# Patient Record
Sex: Male | Born: 1960 | Race: White | Hispanic: No | Marital: Married | Smoking: Never smoker
Health system: Southern US, Community
[De-identification: ages and names within clinical notes are randomized; demographics above are authoritative.]

## PROBLEM LIST (undated history)

## (undated) DIAGNOSIS — C4490 Unspecified malignant neoplasm of skin, unspecified: Secondary | ICD-10-CM

## (undated) DIAGNOSIS — F419 Anxiety disorder, unspecified: Secondary | ICD-10-CM

## (undated) DIAGNOSIS — F320 Major depressive disorder, single episode, mild: Secondary | ICD-10-CM

## (undated) DIAGNOSIS — F32A Depression, unspecified: Secondary | ICD-10-CM

## (undated) HISTORY — DX: Anxiety disorder, unspecified: F41.9

## (undated) HISTORY — DX: Major depressive disorder, single episode, mild: F32.0

## (undated) HISTORY — DX: Depression, unspecified: F32.A

## (undated) HISTORY — PX: CYST REMOVAL HAND: SHX6279

## (undated) HISTORY — DX: Unspecified malignant neoplasm of skin, unspecified: C44.90

## (undated) HISTORY — PX: SKIN BIOPSY: SHX1

## (undated) HISTORY — PX: SKIN CANCER EXCISION: SHX779

---

## 2009-06-16 ENCOUNTER — Ambulatory Visit: Payer: Self-pay | Admitting: Family Medicine

## 2009-06-16 LAB — CONVERTED CEMR LAB
Bilirubin Urine: NEGATIVE
Blood in Urine, dipstick: NEGATIVE
Glucose, Urine, Semiquant: NEGATIVE
Ketones, urine, test strip: NEGATIVE
Nitrite: NEGATIVE
Protein, U semiquant: NEGATIVE
Specific Gravity, Urine: 1.025
Urobilinogen, UA: 0.2
WBC Urine, dipstick: NEGATIVE
pH: 6

## 2009-06-20 LAB — CONVERTED CEMR LAB
ALT: 23 units/L (ref 0–53)
Alkaline Phosphatase: 53 units/L (ref 39–117)
Basophils Relative: 0.6 % (ref 0.0–3.0)
Bilirubin, Direct: 0.1 mg/dL (ref 0.0–0.3)
Calcium: 9.3 mg/dL (ref 8.4–10.5)
Creatinine, Ser: 1.1 mg/dL (ref 0.4–1.5)
Eosinophils Relative: 1.1 % (ref 0.0–5.0)
GFR calc non Af Amer: 76.34 mL/min (ref >60)
HDL: 65 mg/dL (ref >39.00)
LDL Cholesterol: 45 mg/dL (ref 0–99)
Lymphocytes Relative: 30.9 % (ref 12.0–46.0)
Neutrophils Relative %: 60.3 % (ref 43.0–77.0)
Platelets: 244 10*3/uL (ref 150.0–400.0)
RBC: 4.58 M/uL (ref 4.22–5.81)
Total Bilirubin: 1.6 mg/dL — ABNORMAL HIGH (ref 0.3–1.2)
Total CHOL/HDL Ratio: 2
Total Protein: 6.9 g/dL (ref 6.0–8.3)
Triglycerides: 197 mg/dL — ABNORMAL HIGH (ref 0.0–149.0)
VLDL: 39.4 mg/dL (ref 0.0–40.0)
WBC: 7.5 10*3/uL (ref 4.5–10.5)

## 2009-12-19 ENCOUNTER — Ambulatory Visit: Payer: Self-pay | Admitting: Family Medicine

## 2010-04-11 NOTE — Assessment & Plan Note (Signed)
Summary: flush in face/njr   Vital Signs:  Patient profile:   50 year old male Weight:      169 pounds Temp:     98.6 degrees F oral Pulse rate:   100 / minute Pulse rhythm:   regular BP sitting:   160 / 84  Vitals Entered By: Lynann Beaver CMA (December 19, 2009 4:48 PM) CC: facial flushing today Is Patient Diabetic? No Pain Assessment Patient in pain? no        CC:  facial flushing today.  History of Present Illness: Vincent [name] is a 50 year old, married........... x 15 months........Marland Kitchen previously divorced and single for many years.......... who comes in today concerned about possible STDs.  His new wife went to see her doctor last Friday because of dysuria and a rash around her vagina.  The cultures are pending.  He would like to discuss the various kinds of STDs.  He, himself has never had an STD.  However, he would like an HIV screening test.  Current Medications (verified): 1)  Aspirin 325 Mg Tabs (Aspirin) .... One By Mouth Daily  Allergies (verified): No Known Drug Allergies  Social History: Reviewed history from 06/16/2009 and no changes required. Occupation: computers Divorced Never Smoked Alcohol use-yes Drug use-no  Review of Systems      See HPI  Physical Exam  General:  Well-developed,well-nourished,in no acute distress; alert,appropriate and cooperative throughout examination   Impression & Recommendations:  Problem # 1:  SCREENING EXAMINATION FOR VENEREAL DISEASE (ICD-V74.5) Assessment New  Orders: Venipuncture (62952) T-HIV Antibody  (Reflex) (84132-44010) Specimen Handling (27253)  Complete Medication List: 1)  Aspirin 325 Mg Tabs (Aspirin) .... One by mouth daily  Patient Instructions: 1)  I will call you and I get the report

## 2010-04-11 NOTE — Assessment & Plan Note (Signed)
Summary: BRAND NEW PT/TO ESTABLISH/CJR   Vital Signs:  Patient profile:   50 year old male Height:      73 inches Weight:      182 pounds BMI:     24.10 Temp:     98.3 degrees F oral BP sitting:   120 / 88  (left arm)  Vitals Entered By: Kern Reap CMA Duncan Dull) (June 16, 2009 3:57 PM) CC: new to establish care Is Patient Diabetic? No Pain Assessment Patient in pain? no        CC:  new to establish care.  History of Present Illness: Vincent [name] is a 50 year old, divorced male, who comes in today as a new patient for physical examination  He's always been in excellent health.  He said no chronic health problems.  At age 4 he was hospitalized with severe mono.  He also had a skin cancer removed by Dr. Danella Deis from his right shoulder.  Also, last year.  He had a stress fracture of his tibia.  He was seen by orthopedics, rested, and then it resolved.  He is a runner.  Review of systems totally negative except his skin issues.  One raised in [place], [place], and obviously a lot of sun exposure as a child.  Tetanus booster unknown.  Will give him a booster today  Preventive Screening-Counseling & Management  Alcohol-Tobacco     Smoking Status: never  Hep-HIV-STD-Contraception     Dental Visit-last 6 months yes      Drug Use:  no.    Allergies (verified): No Known Drug Allergies  Past History:  Past medical, surgical, family and social histories (including risk factors) reviewed, and no changes noted (except as noted below).  Past Medical History: cyst removal  Past Surgical History: cyst removal skin cancer, right shoulder, removed by Dr. Danella Deis  Family History: Reviewed history and no changes required. Father: healthy Mother: hx of stroke Siblings: 3 brothers, 2 healthy, 1 diverticulitis, herna, back pain               1 sister - healhty Children:  1 daughter - healthy  Social History: Reviewed history and no changes required. 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 No apparent alopecia or balding. Eyes:  No corneal or conjunctival inflammation noted. EOMI. Perrla. Funduscopic exam benign, without hemorrhages, exudates or papilledema. Vision grossly normal. Ears:  External ear exam shows no significant lesions or deformities.  Otoscopic examination reveals clear canals, tympanic membranes are intact bilaterally without bulging, retraction, inflammation or discharge. Hearing is grossly normal bilaterally. Nose:  External nasal examination shows no deformity or inflammation. Nasal mucosa are pink and moist without lesions or exudates. Mouth:  Oral mucosa and oropharynx without lesions or exudates.  Teeth in good repair. Neck:  No deformities, masses, or tenderness noted. Chest Wall:  No deformities, masses, tenderness or gynecomastia noted. Breasts:  No masses or gynecomastia noted Lungs:  Normal respiratory effort, chest expands symmetrically. Lungs are clear to auscultation, no crackles or wheezes. Heart:  Normal rate and regular rhythm. S1 and S2 normal without gallop, murmur, click, rub or other extra sounds. Abdomen:  Bowel sounds positive,abdomen soft and non-tender without masses, organomegaly or hernias  noted. Rectal:  No external abnormalities noted. Normal sphincter tone. No rectal masses or tenderness. Genitalia:  Testes bilaterally descended without nodularity, tenderness or masses. No scrotal masses or lesions. No penis lesions or urethral discharge. Prostate:  Prostate gland firm and smooth, no enlargement, nodularity, tenderness, mass, asymmetry or induration. Msk:  No deformity or scoliosis noted of thoracic  or lumbar spine.   Pulses:  R and L carotid,radial,femoral,dorsalis pedis and posterior tibial pulses are full and equal bilaterally Extremities:  No clubbing, cyanosis, edema, or deformity noted with normal full range of motion of all joints.   Neurologic:  No cranial nerve deficits noted. Station and gait are normal. Plantar reflexes are down-going bilaterally. DTRs are symmetrical throughout. Sensory, motor and coordinative functions appear intact. Skin:  Intact without suspicious lesions or rashes Cervical Nodes:  No lymphadenopathy noted Axillary Nodes:  No palpable lymphadenopathy Inguinal Nodes:  No significant adenopathy Psych:  Cognition and judgment appear intact. Alert and cooperative with normal attention span and concentration. No apparent delusions, illusions, hallucinations   Impression & Recommendations:  Problem # 1:  Preventive Health Care (ICD-V70.0) Assessment New  Other Orders: Venipuncture (13086) TLB-Lipid Panel (80061-LIPID) TLB-BMP (Basic Metabolic Panel-BMET) (80048-METABOL) TLB-CBC Platelet - w/Differential (85025-CBCD) TLB-Hepatic/Liver Function Pnl (80076-HEPATIC) TLB-TSH (Thyroid Stimulating Hormone) (84443-TSH) UA Dipstick w/o Micro (automated)  (81003) EKG w/ Interpretation (93000) Tdap => 68yrs IM (57846) Admin 1st Vaccine (96295)  Patient Instructions: 1)  It is important that you exercise regularly at least 20 minutes 5 times a week. If you develop chest pain, have severe difficulty breathing, or feel very tired , stop exercising immediately and seek medical attention. 2)  Take an Aspirin every day 3)  If  all your lab work is normal, then I would recommend u  get a general physical examination every two years. 4)  Obviously get a thorough skin exam yearly with your history of skin cancer  Laboratory Results   Urine Tests  Date/Time Recieved: June 16, 2009 4:42 PM  Date/Time Reported: June 16, 2009 4:42 PM   Routine Urinalysis   Color:  yellow Appearance: Clear Glucose: negative   (Normal Range: Negative) Bilirubin: negative   (Normal Range: Negative) Ketone: negative   (Normal Range: Negative) Spec. Gravity: 1.025   (Normal Range: 1.003-1.035) Blood: negative   (Normal Range: Negative) pH: 6.0   (Normal Range: 5.0-8.0) Protein: negative   (Normal Range: Negative) Urobilinogen: 0.2   (Normal Range: 0-1) Nitrite: negative   (Normal Range: Negative) Leukocyte Esterace: negative   (Normal Range: Negative)    Comments: Wynona Canes, CMA  June 16, 2009 4:42 PM       Immunizations Administered:  Tetanus Vaccine:    Vaccine Type: Tdap    Site: left deltoid    Mfr: [email or web address]    Dose: 0.5 ml    Route: IM    Given by: Kern Reap CMA (AAMA)    Exp. Date: 06/04/2011    Lot #: [id number]    Physician counseled: yes

## 2010-04-14 ENCOUNTER — Encounter: Payer: Self-pay | Admitting: Family Medicine

## 2010-04-14 ENCOUNTER — Ambulatory Visit (INDEPENDENT_AMBULATORY_CARE_PROVIDER_SITE_OTHER): Payer: BC Managed Care – PPO | Admitting: Family Medicine

## 2010-04-14 VITALS — BP 124/88 | Temp 98.1°F | Ht 73.0 in | Wt 184.0 lb

## 2010-04-14 DIAGNOSIS — J069 Acute upper respiratory infection, unspecified: Secondary | ICD-10-CM

## 2010-04-14 MED ORDER — HYDROCODONE-HOMATROPINE 5-1.5 MG/5ML PO SYRP: 5.0000 mL | ORAL_SOLUTION | Freq: Four times a day (QID) | ORAL | Status: AC | PRN

## 2010-04-14 MED ORDER — HYDROCODONE-HOMATROPINE 5-1.5 MG/5ML PO SYRP: 5.0000 mL | ORAL_SOLUTION | Freq: Four times a day (QID) | ORAL | Status: DC | PRN

## 2010-04-14 NOTE — Progress Notes (Signed)
  Subjective:    Patient ID: Vincent [name], male    DOB: 04/10/1960, 50 y.o.   MRN: [id number]  HPI  [name] Is a 50 year old male, nonsmoker, who comes in today for a fever, chills, and cough for the past 4 to 5 days.  The cough actually start on Monday.  The fever and chills started on Wednesday, fever and chills are gone now.  He still has a cough.  No sputum production.  No earaches or sore throat.  Review of systems negative.  Review of Systems    neg Objective:   Physical Exam     He is a well-developed, well-nourished, male in no acute distress.  Examination of the HEENT are negative.  The neck was supple.  Thyroid not enlarged.  No adenopathy.  Chest was clear to auscultation.  No wheezing no crackles  Assessment & Plan:  Viral syndrome,,,,,,,,,, drink lots of water, Tylenol for fever, Hydromet one to 2 teaspoons t.i.d., p.r.n. Cough.  Return p.r.n.

## 2010-04-14 NOTE — Patient Instructions (Signed)
Drink lots of fluids, Tylenol for fever, Hydromet one half to 1 teaspoon 3 times a day as needed for cough and cold.  Return p.r.n.

## 2011-03-28 ENCOUNTER — Ambulatory Visit (INDEPENDENT_AMBULATORY_CARE_PROVIDER_SITE_OTHER): Payer: BC Managed Care – PPO | Admitting: Family Medicine

## 2011-03-28 ENCOUNTER — Encounter: Payer: Self-pay | Admitting: Family Medicine

## 2011-03-28 VITALS — BP 130/90 | Temp 98.6°F | Ht 73.0 in | Wt 176.0 lb

## 2011-03-28 DIAGNOSIS — M545 Low back pain: Secondary | ICD-10-CM

## 2011-03-28 DIAGNOSIS — M549 Dorsalgia, unspecified: Secondary | ICD-10-CM

## 2011-03-28 MED ORDER — CYCLOBENZAPRINE HCL 5 MG PO TABS: ORAL_TABLET | ORAL | Status: DC

## 2011-03-28 MED ORDER — HYDROCODONE-ACETAMINOPHEN 7.5-750 MG PO TABS: ORAL_TABLET | ORAL | Status: DC

## 2011-03-28 NOTE — Progress Notes (Signed)
  Subjective:    Patient ID: Vincent [name], male    DOB: [birth year], 52 y.o.   MRN: [id number]  HPI Vincent [name] is a 51 year old male, nonsmoker, who comes in today for evaluation of low back pain.  He states about 7 years ago.  He was doing some yard work bent over and developed severe pain in his back pain.  He saw a neurosurgeon at that time had some foot drop, left.  MRI showed a ruptured disk, however, he was treated symptomatically and conservatively, and got better without surgery.  Since that, time.  He's had intermittent episodes of back pain.  On Monday of this week.  He with pain in his left buttocks, rating down to the lateral portion of his left thigh.  He went out for a run and the exercise did not make his pain worse.  Today he states the pain is dull.  A4 to 8 on a scale of one to 10.  It comes and goes.  He points to the left buttocks as a source of his pain.  Neurologic review of systems otherwise negative.  With this initial episode again, he had foot drop, which resolved   Review of Systems General and neurologic and neurosurgical review of systems otherwise negative    Objective:   Physical Exam  Well-developed well-nourished, male in no acute distress, very thin, very muscular.  Examination of the spine shows no bony tenderness.  There is no tenderness in the lumbar muscles.  In the supine position.  The legs were of equal length.  Sensation muscle strength all normal.  Positive straight leg raising left leg at 75 degrees.  Reflexes were normal except in left ankle Achilles reflex      Assessment & Plan:  Lumbar disk disease.  Plan treat conservatively with anti-inflammatories exercise begin physical therapy.  If symptoms do not abate with home treatment

## 2011-03-28 NOTE — Patient Instructions (Signed)
Do the stretching yoga-type exercises twice daily.  Motrin 600 mg twice daily with food.  Flexeril and Vicodin.......... One half of each at bedtime as needed.  Return p.r.n.

## 2011-04-25 ENCOUNTER — Ambulatory Visit: Payer: BC Managed Care – PPO | Admitting: Family

## 2011-04-26 ENCOUNTER — Ambulatory Visit (INDEPENDENT_AMBULATORY_CARE_PROVIDER_SITE_OTHER): Payer: BC Managed Care – PPO | Admitting: Family Medicine

## 2011-04-26 ENCOUNTER — Encounter: Payer: Self-pay | Admitting: Family Medicine

## 2011-04-26 VITALS — BP 100/70 | Temp 98.2°F | Wt 174.0 lb

## 2011-04-26 DIAGNOSIS — R109 Unspecified abdominal pain: Secondary | ICD-10-CM | POA: Insufficient documentation

## 2011-04-26 DIAGNOSIS — M549 Dorsalgia, unspecified: Secondary | ICD-10-CM

## 2011-04-26 LAB — POCT URINALYSIS DIPSTICK
Bilirubin, UA: NEGATIVE
Blood, UA: NEGATIVE
Glucose, UA: NEGATIVE
Ketones, UA: NEGATIVE
Leukocytes, UA: NEGATIVE
Nitrite, UA: NEGATIVE
pH, UA: 5

## 2011-04-26 NOTE — Patient Instructions (Signed)
Drink lots of liquids  We will get you set up for a scan ASAP  I will call you the report

## 2011-04-26 NOTE — Progress Notes (Signed)
  Subjective:    Patient ID: Vincent [name], male    DOB: Oct 10, 1960, 51 y.o.   MRN: [id number]  HPI Vincent [name] is a 51 year old male nonsmoker who comes in today for evaluation of right flank pain  He states yesterday he was sitting at home working when he developed the sudden onset of severe right flank pain. His pain on a scale of 1-10 was a 10+. It was so intense he thought he might pass out. In 15 minutes the pain suddenly stopped. An hour later the same pain recurred this time it lasted for about 30 minutes and then suddenly stopped. He said no history of kidney stones in the past.  No fever chills nausea vomiting etc.   Review of Systems Gen. and uro review of systems otherwise negative    general and neurologic review of systems otherwise negative Objective:   Physical Exam Well-developed well-nourished male in no acute distress examination the back is normal urinalysis is normal       Assessment & Plan:Right flank pain question kidney stone plan CTA right flank pain question kidney stone plan CTA

## 2011-04-27 ENCOUNTER — Other Ambulatory Visit: Payer: Self-pay | Admitting: Family Medicine

## 2011-04-27 ENCOUNTER — Ambulatory Visit (INDEPENDENT_AMBULATORY_CARE_PROVIDER_SITE_OTHER)
Admission: RE | Admit: 2011-04-27 | Discharge: 2011-04-27 | Disposition: A | Discharge status: Home / Self Care | Payer: BC Managed Care – PPO | Source: Ambulatory Visit | Attending: Family Medicine | Admitting: Family Medicine

## 2011-04-27 DIAGNOSIS — M549 Dorsalgia, unspecified: Secondary | ICD-10-CM

## 2011-08-16 ENCOUNTER — Encounter: Payer: Self-pay | Admitting: Family Medicine

## 2011-08-16 ENCOUNTER — Ambulatory Visit (INDEPENDENT_AMBULATORY_CARE_PROVIDER_SITE_OTHER): Payer: BC Managed Care – PPO | Admitting: Family Medicine

## 2011-08-16 VITALS — BP 110/70 | Temp 98.1°F | Wt 170.0 lb

## 2011-08-16 DIAGNOSIS — H6120 Impacted cerumen, unspecified ear: Secondary | ICD-10-CM

## 2011-08-16 DIAGNOSIS — H918X9 Other specified hearing loss, unspecified ear: Secondary | ICD-10-CM

## 2011-08-16 NOTE — Progress Notes (Signed)
  Subjective:    Patient ID: Vincent [name], male    DOB: 24-Mar-1960, 51 y.o.   MRN: [id number]  HPI Vincent [name] is a 51 year old male who comes in today for evaluation of bilateral hearing loss  He's had a history of cerumen impactions in the past    Review of Systems    general and ENT review of systems otherwise negative Objective:   Physical Exam Well-developed well-nourished male in no acute distress HEENT pertinent he has bilateral cerumen impactions that was relieved by irrigation       Assessment & Plan:  Hearing loss secondary to cerumen impactions cerumen removed hearing normal

## 2011-08-16 NOTE — Patient Instructions (Signed)
Return when necessary 

## 2011-11-15 ENCOUNTER — Other Ambulatory Visit: Payer: BC Managed Care – PPO

## 2011-11-16 ENCOUNTER — Other Ambulatory Visit (INDEPENDENT_AMBULATORY_CARE_PROVIDER_SITE_OTHER): Payer: BC Managed Care – PPO

## 2011-11-16 DIAGNOSIS — Z0000 Encounter for general adult medical examination without abnormal findings: Secondary | ICD-10-CM

## 2011-11-16 LAB — CBC WITH DIFFERENTIAL/PLATELET
Eosinophils Relative: 1.2 % (ref 0.0–5.0)
HCT: 45 % (ref 39.0–52.0)
Hemoglobin: 14.8 g/dL (ref 13.0–17.0)
Lymphs Abs: 2.2 10*3/uL (ref 0.7–4.0)
Monocytes Relative: 7.1 % (ref 3.0–12.0)
Neutro Abs: 3.5 10*3/uL (ref 1.4–7.7)
RBC: 4.87 Mil/uL (ref 4.22–5.81)
WBC: 6.3 10*3/uL (ref 4.5–10.5)

## 2011-11-16 LAB — LIPID PANEL
HDL: 69.4 mg/dL (ref >39.00)
LDL Cholesterol: 80 mg/dL (ref 0–99)
Total CHOL/HDL Ratio: 3

## 2011-11-16 LAB — HEPATIC FUNCTION PANEL
ALT: 24 U/L (ref 0–53)
AST: 22 U/L (ref 0–37)
Albumin: 4.3 g/dL (ref 3.5–5.2)
Alkaline Phosphatase: 52 U/L (ref 39–117)

## 2011-11-16 LAB — TSH: TSH: 1.11 u[IU]/mL (ref 0.35–5.50)

## 2011-11-16 LAB — POCT URINALYSIS DIPSTICK
Bilirubin, UA: NEGATIVE
Blood, UA: NEGATIVE
Nitrite, UA: NEGATIVE
Spec Grav, UA: 1.015
Urobilinogen, UA: 0.2
pH, UA: 7

## 2011-11-16 LAB — BASIC METABOLIC PANEL
GFR: 86.34 mL/min (ref >60.00)
Potassium: 4.3 mEq/L (ref 3.5–5.1)
Sodium: 142 mEq/L (ref 135–145)

## 2011-11-22 ENCOUNTER — Encounter: Payer: Self-pay | Admitting: Family Medicine

## 2011-11-22 ENCOUNTER — Ambulatory Visit (INDEPENDENT_AMBULATORY_CARE_PROVIDER_SITE_OTHER): Payer: BC Managed Care – PPO | Admitting: Family Medicine

## 2011-11-22 VITALS — BP 110/78 | Temp 98.1°F | Ht 73.0 in | Wt 171.0 lb

## 2011-11-22 DIAGNOSIS — Z23 Encounter for immunization: Secondary | ICD-10-CM

## 2011-11-22 DIAGNOSIS — H918X9 Other specified hearing loss, unspecified ear: Secondary | ICD-10-CM

## 2011-11-22 DIAGNOSIS — Z0000 Encounter for general adult medical examination without abnormal findings: Secondary | ICD-10-CM

## 2011-11-22 DIAGNOSIS — H6120 Impacted cerumen, unspecified ear: Secondary | ICD-10-CM

## 2011-11-22 NOTE — Progress Notes (Signed)
  Subjective:    Patient ID: Vincent [name], male    DOB: 02/11/1961, 51 y.o.   MRN: [id number]  HPI Vincent [name] is a 51 year old male married nonsmoker,,,,,, for exercise he runs on a regular basis,,,,,, who grew up in [place] and has had a lot of sun damage and indeed had a skin cancer on his right shoulder and the other nonmalignant reason on his face he has been seen on a yearly basis by Dr. Danella Deis his dermatologist. He comes in today for general physical examination  He's always been in excellent health other than above  He gets routine eye care because his father had a history of glaucoma, regular dental care, tetanus 2011 flu shot here    Review of Systems  Constitutional: Negative.   HENT: Negative.   Eyes: Negative.   Respiratory: Negative.   Cardiovascular: Negative.   Gastrointestinal: Negative.   Genitourinary: Negative.   Musculoskeletal: Negative.   Skin: Negative.   Neurological: Negative.   Hematological: Negative.   Psychiatric/Behavioral: Negative.        Objective:   Physical Exam  Constitutional: He is oriented to person, place, and time. He appears well-developed and well-nourished.  HENT:  Head: Normocephalic and atraumatic.  Right Ear: External ear normal.  Left Ear: External ear normal.  Nose: Nose normal.  Mouth/Throat: Oropharynx is clear and moist.  Eyes: Conjunctivae normal and EOM are normal. Pupils are equal, round, and reactive to light.  Neck: Normal range of motion. Neck supple. No JVD present. No tracheal deviation present. No thyromegaly present.  Cardiovascular: Normal rate, regular rhythm, normal heart sounds and intact distal pulses.  Exam reveals no gallop and no friction rub.   No murmur heard. Pulmonary/Chest: Effort normal and breath sounds normal. No stridor. No respiratory distress. He has no wheezes. He has no rales. He exhibits no tenderness.  Abdominal: Soft. Bowel sounds are normal. He exhibits no distension and no mass. There is no  tenderness. There is no rebound and no guarding.  Genitourinary: Rectum normal, prostate normal and penis normal. Guaiac negative stool. No penile tenderness.  Musculoskeletal: Normal range of motion. He exhibits no edema and no tenderness.  Lymphadenopathy:    He has no cervical adenopathy.  Neurological: He is alert and oriented to person, place, and time. He has normal reflexes. No cranial nerve deficit. He exhibits normal muscle tone.  Skin: Skin is warm and dry. No rash noted. No erythema. No pallor.       Total body skin exam normal except for a 2 inch scar posterior right shoulder where he had a lesion excised by Dr. Danella Deis  Psychiatric: He has a normal mood and affect. His behavior is normal. Judgment and thought content normal.          Assessment & Plan:  Healthy male  Chronic sun damage yearly exam by dermatologist monthly exam at home  Ear wax removed with suction irrigation

## 2011-11-22 NOTE — Patient Instructions (Signed)
Continue your good health habits  Return in one year for general physical exam

## 2012-01-17 ENCOUNTER — Ambulatory Visit (INDEPENDENT_AMBULATORY_CARE_PROVIDER_SITE_OTHER): Payer: BC Managed Care – PPO | Admitting: Family Medicine

## 2012-01-17 ENCOUNTER — Encounter: Payer: Self-pay | Admitting: Family Medicine

## 2012-01-17 VITALS — BP 120/80 | Temp 98.1°F | Wt 171.0 lb

## 2012-01-17 DIAGNOSIS — F341 Dysthymic disorder: Secondary | ICD-10-CM

## 2012-01-17 DIAGNOSIS — F329 Major depressive disorder, single episode, unspecified: Secondary | ICD-10-CM

## 2012-01-17 MED ORDER — CITALOPRAM HYDROBROMIDE 20 MG PO TABS: ORAL_TABLET | ORAL | Status: DC

## 2012-01-17 NOTE — Patient Instructions (Addendum)
1 tab nightly at bedtime x3 months  If after 3 months you're feeling well then taper the Celexa by taking a half a tablet daily for 2 weeks then a half a tablet Monday Wednesday Friday for 2 weeks and stopping  Call if you have any concerns or questions

## 2012-01-17 NOTE — Progress Notes (Signed)
  Subjective:    Patient ID: Vincent [name], male    DOB: [birth year], 51 y.o.   MRN: [id number]  HPI Vincent [name] is a 51 year old married male nonsmoker who comes in to discuss his father's death  His father died on October 15, 2024and the funeral is not until November 23 because the family wants to wait till his brother gets out of jail on November 22. This does cause a lot of family conflict. Gym and his wife are going to see a counselor he Rishel cancer didn't seem to help him so just recently switched to somebody else. His symptoms are decreased mood decreased energy states he's sleeping okay.  No previous history of depression   Review of Systems General and psychiatric review of systems otherwise negative    Objective:   Physical Exam Well-developed well-nourished male in no acute distress he is appropriate oriented x3 not psychotic not suicidal       Assessment & Plan:  Situational depression plan Celexa 20 mg daily taper after 3 months

## 2013-03-02 ENCOUNTER — Other Ambulatory Visit (INDEPENDENT_AMBULATORY_CARE_PROVIDER_SITE_OTHER): Payer: BC Managed Care – PPO

## 2013-03-02 DIAGNOSIS — Z0000 Encounter for general adult medical examination without abnormal findings: Secondary | ICD-10-CM

## 2013-03-02 LAB — CBC WITH DIFFERENTIAL/PLATELET
Basophils Absolute: 0 10*3/uL (ref 0.0–0.1)
Eosinophils Absolute: 0.1 10*3/uL (ref 0.0–0.7)
Hemoglobin: 15 g/dL (ref 13.0–17.0)
Lymphocytes Relative: 40 % (ref 12.0–46.0)
MCHC: 33.3 g/dL (ref 30.0–36.0)
MCV: 92.8 fl (ref 78.0–100.0)
Neutro Abs: 2.7 10*3/uL (ref 1.4–7.7)
RDW: 14.3 % (ref 11.5–14.6)

## 2013-03-02 LAB — POCT URINALYSIS DIPSTICK
Bilirubin, UA: NEGATIVE
Blood, UA: NEGATIVE
Glucose, UA: NEGATIVE
Ketones, UA: NEGATIVE
Spec Grav, UA: 1.015

## 2013-03-02 LAB — HEPATIC FUNCTION PANEL
ALT: 19 U/L (ref 0–53)
AST: 22 U/L (ref 0–37)
Bilirubin, Direct: 0.2 mg/dL (ref 0.0–0.3)
Total Bilirubin: 1.6 mg/dL — ABNORMAL HIGH (ref 0.3–1.2)

## 2013-03-02 LAB — BASIC METABOLIC PANEL
CO2: 30 mEq/L (ref 19–32)
Calcium: 9.2 mg/dL (ref 8.4–10.5)
Chloride: 101 mEq/L (ref 96–112)
Creatinine, Ser: 1 mg/dL (ref 0.4–1.5)
GFR: 86.9 mL/min (ref >60.00)
Sodium: 139 mEq/L (ref 135–145)

## 2013-03-02 LAB — LIPID PANEL
Cholesterol: 179 mg/dL (ref 0–200)
Triglycerides: 133 mg/dL (ref 0.0–149.0)

## 2013-03-10 ENCOUNTER — Ambulatory Visit (INDEPENDENT_AMBULATORY_CARE_PROVIDER_SITE_OTHER): Payer: BC Managed Care – PPO | Admitting: Family Medicine

## 2013-03-10 ENCOUNTER — Encounter: Payer: Self-pay | Admitting: Family Medicine

## 2013-03-10 VITALS — BP 120/80 | Temp 98.6°F | Ht 72.75 in | Wt 182.0 lb

## 2013-03-10 DIAGNOSIS — F329 Major depressive disorder, single episode, unspecified: Secondary | ICD-10-CM

## 2013-03-10 DIAGNOSIS — F4321 Adjustment disorder with depressed mood: Secondary | ICD-10-CM

## 2013-03-10 DIAGNOSIS — Z0000 Encounter for general adult medical examination without abnormal findings: Secondary | ICD-10-CM

## 2013-03-10 MED ORDER — CITALOPRAM HYDROBROMIDE 20 MG PO TABS: ORAL_TABLET | ORAL | Status: DC

## 2013-03-10 NOTE — Progress Notes (Signed)
Pre visit review using our clinic review tool, if applicable. No additional management support is needed unless otherwise documented below in the visit note. 

## 2013-03-10 NOTE — Progress Notes (Signed)
   Subjective:    Patient ID: Vincent [name], male    DOB: 1960-07-25, 52 y.o.   MRN: [id number]  HPI Vincent [name] is a 52 year old married male nonsmoker who comes in today for general physical examination  He works at the [employer] and tells me that he urologist came in to do prostate exam on at work.  It was normal  He takes an aspirin tablet and 20 mg of Celexa bedtime  He does not get routine eye care recommend Dr. Randon Goldsmith group, regular dental care, never had a colonoscopy.   Review of Systems  Constitutional: Negative.   HENT: Negative.   Eyes: Negative.   Respiratory: Negative.   Cardiovascular: Negative.   Gastrointestinal: Negative.   Genitourinary: Negative.   Musculoskeletal: Negative.   Skin: Negative.   Neurological: Negative.   Psychiatric/Behavioral: Negative.        Objective:   Physical Exam  Nursing note and vitals reviewed. Constitutional: He is oriented to person, place, and time. He appears well-developed and well-nourished.  HENT:  Head: Normocephalic and atraumatic.  Right Ear: External ear normal.  Left Ear: External ear normal.  Nose: Nose normal.  Mouth/Throat: Oropharynx is clear and moist.  Eyes: Conjunctivae and EOM are normal. Pupils are equal, round, and reactive to light.  Neck: Normal range of motion. Neck supple. No JVD present. No tracheal deviation present. No thyromegaly present.  Cardiovascular: Normal rate, regular rhythm, normal heart sounds and intact distal pulses.  Exam reveals no gallop and no friction rub.   No murmur heard. Pulmonary/Chest: Effort normal and breath sounds normal. No stridor. No respiratory distress. He has no wheezes. He has no rales. He exhibits no tenderness.  Abdominal: Soft. Bowel sounds are normal. He exhibits no distension and no mass. There is no tenderness. There is no rebound and no guarding.  Genitourinary:  Done by urology at work therefore not repeated  Musculoskeletal: Normal range of motion. He exhibits no  edema and no tenderness.  Lymphadenopathy:    He has no cervical adenopathy.  Neurological: He is alert and oriented to person, place, and time. He has normal reflexes. No cranial nerve deficit. He exhibits normal muscle tone.  Skin: Skin is warm and dry. No rash noted. No erythema. No pallor.  Psychiatric: He has a normal mood and affect. His behavior is normal. Judgment and thought content normal.          Assessment & Plan:  Healthy male  History of mild depression continue Celexa 20 mg each bedtime

## 2013-03-10 NOTE — Patient Instructions (Signed)
Continue the aspirin and one Celexa daily  Return in one year sooner if any problems

## 2013-03-11 ENCOUNTER — Encounter: Payer: Self-pay | Admitting: Internal Medicine

## 2013-04-21 ENCOUNTER — Ambulatory Visit (AMBULATORY_SURGERY_CENTER): Payer: Self-pay

## 2013-04-21 VITALS — Ht 73.0 in | Wt 182.0 lb

## 2013-04-21 DIAGNOSIS — Z1211 Encounter for screening for malignant neoplasm of colon: Secondary | ICD-10-CM

## 2013-04-21 MED ORDER — MOVIPREP 100 G PO SOLR: ORAL | Status: DC

## 2013-04-23 ENCOUNTER — Encounter: Payer: Self-pay | Admitting: Internal Medicine

## 2013-05-05 ENCOUNTER — Encounter: Payer: Self-pay | Admitting: Internal Medicine

## 2013-05-05 ENCOUNTER — Ambulatory Visit (AMBULATORY_SURGERY_CENTER): Payer: BC Managed Care – PPO | Admitting: Internal Medicine

## 2013-05-05 VITALS — BP 128/73 | HR 52 | Temp 98.6°F | Resp 17 | Ht 73.0 in | Wt 182.0 lb

## 2013-05-05 DIAGNOSIS — Z1211 Encounter for screening for malignant neoplasm of colon: Secondary | ICD-10-CM

## 2013-05-05 DIAGNOSIS — D126 Benign neoplasm of colon, unspecified: Secondary | ICD-10-CM

## 2013-05-05 MED ORDER — SODIUM CHLORIDE 0.9 % IV SOLN: 500.0000 mL | INTRAVENOUS | Status: DC

## 2013-05-05 NOTE — Progress Notes (Signed)
Called to room to assist during endoscopic procedure.  Patient ID and intended procedure confirmed with present staff. Received instructions for my participation in the procedure from the performing physician.  

## 2013-05-05 NOTE — Progress Notes (Signed)
No egg or soy allergy. ewm No problems with past sedation. ewm

## 2013-05-05 NOTE — Patient Instructions (Signed)
YOU HAD AN ENDOSCOPIC PROCEDURE TODAY AT THE [facility]: Refer to the procedure report that was given to you for any specific questions about what was found during the examination.  If the procedure report does not answer your questions, please call your gastroenterologist to clarify.  If you requested that your care partner not be given the details of your procedure findings, then the procedure report has been included in a sealed envelope for you to review at your convenience later.  YOU SHOULD EXPECT: Some feelings of bloating in the abdomen. Passage of more gas than usual.  Walking can help get rid of the air that was put into your GI tract during the procedure and reduce the bloating. If you had a lower endoscopy (such as a colonoscopy or flexible sigmoidoscopy) you may notice spotting of blood in your stool or on the toilet paper. If you underwent a bowel prep for your procedure, then you may not have a normal bowel movement for a few days.  DIET: Your first meal following the procedure should be a light meal and then it is ok to progress to your normal diet.  A half-sandwich or bowl of soup is an example of a good first meal.  Heavy or fried foods are harder to digest and may make you feel nauseous or bloated.  Likewise meals heavy in dairy and vegetables can cause extra gas to form and this can also increase the bloating.  Drink plenty of fluids but you should avoid alcoholic beverages for 24 hours.  ACTIVITY: Your care partner should take you home directly after the procedure.  You should plan to take it easy, moving slowly for the rest of the day.  You can resume normal activity the day after the procedure however you should NOT DRIVE or use heavy machinery for 24 hours (because of the sedation medicines used during the test).    SYMPTOMS TO REPORT IMMEDIATELY: A gastroenterologist can be reached at any hour.  During normal business hours, 8:30 AM to 5:00 PM Monday through Friday,  call [phone].  After hours and on weekends, please call the GI answering service at [phone] who will take a message and have the physician on call contact you.   Following lower endoscopy (colonoscopy or flexible sigmoidoscopy):  Excessive amounts of blood in the stool  Significant tenderness or worsening of abdominal pains  Swelling of the abdomen that is new, acute  Fever of 100F or higher    FOLLOW UP: If any biopsies were taken you will be contacted by phone or by letter within the next 1-3 weeks.  Call your gastroenterologist if you have not heard about the biopsies in 3 weeks.  Our staff will call the home number listed on your records the next business day following your procedure to check on you and address any questions or concerns that you may have at that time regarding the information given to you following your procedure. This is a courtesy call and so if there is no answer at the home number and we have not heard from you through the emergency physician on call, we will assume that you have returned to your regular daily activities without incident.  SIGNATURES/CONFIDENTIALITY: You and/or your care partner have signed paperwork which will be entered into your electronic medical record.  These signatures attest to the fact that that the information above on your After Visit Summary has been reviewed and is understood.  Full responsibility of the confidentiality   of this discharge information lies with you and/or your care-partner.     

## 2013-05-05 NOTE — Op Note (Signed)
[place]  [address] [place], [address]   COLONOSCOPY PROCEDURE REPORT  PATIENT: Vincent [name], Vincent [name]  MR#: [id number] BIRTHDATE: [birth year] , 44  yrs. old GENDER: Male ENDOSCOPIST: Jerene Bears, MD REFERRED WN:[id number] Delora Fuel, M.D. PROCEDURE DATE:  05/05/2013 PROCEDURE:   Colonoscopy with cold biopsy polypectomy First Screening Colonoscopy - Avg.  risk and is 50 yrs.  old or older Yes.  Prior Negative Screening - Now for repeat screening. N/A  History of Adenoma - Now for follow-up colonoscopy & has been > or = to 3 yrs.  N/A  Polyps Removed Today? Yes. ASA CLASS:   Class I INDICATIONS:average risk screening and first colonoscopy. MEDICATIONS: These medications were titrated to patient response per physician's verbal order and propofol (Diprivan) 400mg  IV  DESCRIPTION OF PROCEDURE:   After the risks benefits and alternatives of the procedure were thoroughly explained, informed consent was obtained.  A digital rectal exam revealed no rectal mass.   The LB [id number]  endoscope was introduced through the anus and advanced to the cecum, which was identified by both the appendix and ileocecal valve. No adverse events experienced. The quality of the prep was good, using MoviPrep  The instrument was then slowly withdrawn as the colon was fully examined.      COLON FINDINGS: Two diminutive sessile polyps, measuring 3-4 mm in size, were found.  Polypectomy was performed with cold forceps. All resections were complete and all polyp tissue was completely retrieved.   Three sessile polyps measuring 3-4 mm in size were found in the sigmoid colon and rectosigmoid colon.  Polypectomy was performed with cold forceps.  All resections were complete and all polyp tissue was completely retrieved.   There was mild scattered diverticulosis noted at the splenic flexure and in the descending colon.  Retroflexed views revealed internal hemorrhoids. The time to cecum=4  minutes 12 seconds.  Withdrawal time=14 minutes 22 seconds.  The scope was withdrawn and the procedure completed.  COMPLICATIONS: There were no complications.  ENDOSCOPIC IMPRESSION: 1.   Two diminutive sessile polyps, measuring 3-4 mm in size, were found; Polypectomy was performed with cold forceps 2.   Three diminutive sessile polyps measuring 3-4 mm in size were found in the sigmoid colon and rectosigmoid colon; Polypectomy was performed with cold forceps 3.   There was mild diverticulosis noted at the splenic flexure and in the descending colon  RECOMMENDATIONS: 1.  Await biopsy results 2.  High fiber diet 3.  Timing of repeat colonoscopy will be determined by pathology findings. 4.  You will receive a letter within 1-2 weeks with the results of your biopsy as well as final recommendations.  Please call my office if you have not received a letter after 3 weeks.   eSigned:  Jerene Bears, MD 05/05/2013 10:52 AM   cc: The Patient and Dorena Cookey, MD   PATIENT NAME:  Vincent, [name] MR#: [id number]

## 2013-05-06 ENCOUNTER — Telehealth: Payer: Self-pay | Admitting: *Deleted

## 2013-05-06 NOTE — Telephone Encounter (Signed)
  Follow up Call-  Call back number 05/05/2013  Post procedure Call Back phone  # [phone]  Permission to leave phone message Yes     Patient questions:  Do you have a fever, pain , or abdominal swelling? no Pain Score  0 *  Have you tolerated food without any problems? yes  Have you been able to return to your normal activities? yes  Do you have any questions about your discharge instructions: Diet   no Medications  no Follow up visit  no  Do you have questions or concerns about your Care? no  Actions: * If pain score is 4 or above: No action needed, pain <4.

## 2013-05-14 ENCOUNTER — Encounter: Payer: Self-pay | Admitting: Internal Medicine

## 2013-08-26 ENCOUNTER — Other Ambulatory Visit: Payer: Self-pay | Admitting: Dermatology

## 2013-12-30 ENCOUNTER — Encounter: Payer: Self-pay | Admitting: Family Medicine

## 2013-12-30 ENCOUNTER — Ambulatory Visit (INDEPENDENT_AMBULATORY_CARE_PROVIDER_SITE_OTHER): Payer: BC Managed Care – PPO | Admitting: Family Medicine

## 2013-12-30 VITALS — BP 140/98 | Temp 97.9°F | Wt 185.0 lb

## 2013-12-30 DIAGNOSIS — M5432 Sciatica, left side: Secondary | ICD-10-CM

## 2013-12-30 MED ORDER — CYCLOBENZAPRINE HCL 5 MG PO TABS: 5.0000 mg | ORAL_TABLET | Freq: Three times a day (TID) | ORAL | Status: DC | PRN

## 2013-12-30 MED ORDER — PREDNISONE 20 MG PO TABS: ORAL_TABLET | ORAL | Status: DC

## 2013-12-30 MED ORDER — TRAMADOL HCL 50 MG PO TABS: 50.0000 mg | ORAL_TABLET | Freq: Three times a day (TID) | ORAL | Status: DC | PRN

## 2013-12-30 NOTE — Progress Notes (Signed)
   Subjective:    Patient ID: Jacobo Moncrief, male    DOB: 10-04-1960, 53 y.o.   MRN: [id number]  HPI Yvone Neu is a 53 year old married male nonsmoker who comes in today for evaluation of acute back pain  He states he was at work yesterday bent to his left and noticed a sudden onset of severe sharp pain to radiate down to his left knee. Yesterday the pain was a 9 out of 7. Does not go below the knee. No numbness or weakness. The pain is decreased by rest and increased by sitting. No bowel or bladder dysfunction no history of trauma  He had an episode like this 3 years ago the last about 10 days went away with a steroid Dosepak. At that time he saw a neurosurgeon who did an evaluation including a back scan which showed a disc problem at L4 L5. They do not feel like he needed surgery he was treated with physical therapy medication and resolve spontaneously   Review of Systems Review of systems negative    Objective:   Physical Exam  Well-developed well-nourished man acute distress vital signs stable he is afebrile in the sitting position he feels fine he is in the lying position without any discomfort.  [place] equal length. Sensation muscle strength reflexes are within normal limits and symmetrical. Positive straight leg raising left at 75      Assessment & Plan:  Lumbar disc disease with acute exacerbation.........Marland Kitchen prednisone burst and taper....... tramadol and Flexeril.....Marland Kitchen continue physical therapy exercises at home return when necessary.Marland Kitchen

## 2013-12-30 NOTE — Progress Notes (Signed)
Pre visit review using our clinic review tool, if applicable. No additional management support is needed unless otherwise documented below in the visit note. 

## 2013-12-30 NOTE — Patient Instructions (Signed)
Prednisone 20 mg,,,,,,,, 2 now and then 2 every morning for 3 days then taper as outlined  Tramadol and Flexeril,,,,,,,,,, one half to one of each 3 times daily  If you're working then you can take a half or 4 one of each at bedtime   continue physical therapy exercises when you're back pain quiets down  Return when necessary

## 2014-02-11 ENCOUNTER — Encounter: Payer: BC Managed Care – PPO | Admitting: Family Medicine

## 2014-02-11 ENCOUNTER — Other Ambulatory Visit (INDEPENDENT_AMBULATORY_CARE_PROVIDER_SITE_OTHER): Payer: BC Managed Care – PPO

## 2014-02-11 ENCOUNTER — Other Ambulatory Visit: Payer: BC Managed Care – PPO | Admitting: Family Medicine

## 2014-02-11 DIAGNOSIS — Z0000 Encounter for general adult medical examination without abnormal findings: Secondary | ICD-10-CM

## 2014-02-11 LAB — COMPREHENSIVE METABOLIC PANEL
ALT: 20 U/L (ref 0–53)
AST: 21 U/L (ref 0–37)
Albumin: 4 g/dL (ref 3.5–5.2)
Alkaline Phosphatase: 46 U/L (ref 39–117)
BUN: 21 mg/dL (ref 6–23)
CALCIUM: 8.8 mg/dL (ref 8.4–10.5)
CHLORIDE: 105 meq/L (ref 96–112)
CO2: 26 mEq/L (ref 19–32)
CREATININE: 1 mg/dL (ref 0.4–1.5)
GFR: 80.78 mL/min (ref >60.00)
Glucose, Bld: 98 mg/dL (ref 70–99)
Potassium: 3.9 mEq/L (ref 3.5–5.1)
Sodium: 138 mEq/L (ref 135–145)
Total Bilirubin: 1.4 mg/dL — ABNORMAL HIGH (ref 0.2–1.2)
Total Protein: 6.5 g/dL (ref 6.0–8.3)

## 2014-02-11 LAB — CBC WITH DIFFERENTIAL/PLATELET
BASOS PCT: 0.6 % (ref 0.0–3.0)
Basophils Absolute: 0 10*3/uL (ref 0.0–0.1)
EOS ABS: 0.1 10*3/uL (ref 0.0–0.7)
Eosinophils Relative: 1.3 % (ref 0.0–5.0)
HEMATOCRIT: 42.8 % (ref 39.0–52.0)
HEMOGLOBIN: 14.4 g/dL (ref 13.0–17.0)
LYMPHS ABS: 1.9 10*3/uL (ref 0.7–4.0)
LYMPHS PCT: 33.1 % (ref 12.0–46.0)
MCHC: 33.6 g/dL (ref 30.0–36.0)
MCV: 91.8 fl (ref 78.0–100.0)
Monocytes Absolute: 0.5 10*3/uL (ref 0.1–1.0)
Monocytes Relative: 8.6 % (ref 3.0–12.0)
NEUTROS ABS: 3.2 10*3/uL (ref 1.4–7.7)
Neutrophils Relative %: 56.4 % (ref 43.0–77.0)
Platelets: 209 10*3/uL (ref 150.0–400.0)
RBC: 4.67 Mil/uL (ref 4.22–5.81)
RDW: 13.6 % (ref 11.5–15.5)
WBC: 5.7 10*3/uL (ref 4.0–10.5)

## 2014-02-11 LAB — POCT URINALYSIS DIPSTICK
Bilirubin, UA: NEGATIVE
GLUCOSE UA: NEGATIVE
Ketones, UA: NEGATIVE
Leukocytes, UA: NEGATIVE
Nitrite, UA: NEGATIVE
Protein, UA: NEGATIVE
RBC UA: NEGATIVE
Spec Grav, UA: 1.015
Urobilinogen, UA: 0.2
pH, UA: 5.5

## 2014-02-11 LAB — LIPID PANEL
Cholesterol: 190 mg/dL (ref 0–200)
HDL: 56.5 mg/dL (ref >39.00)
LDL Cholesterol: 115 mg/dL — ABNORMAL HIGH (ref 0–99)
NonHDL: 133.5
TRIGLYCERIDES: 92 mg/dL (ref 0.0–149.0)
Total CHOL/HDL Ratio: 3
VLDL: 18.4 mg/dL (ref 0.0–40.0)

## 2014-02-11 LAB — PSA: PSA: 0.9 ng/mL (ref 0.10–4.00)

## 2014-02-11 LAB — TSH: TSH: 0.87 u[IU]/mL (ref 0.35–4.50)

## 2014-02-18 ENCOUNTER — Encounter: Payer: Self-pay | Admitting: Family Medicine

## 2014-02-18 ENCOUNTER — Ambulatory Visit (INDEPENDENT_AMBULATORY_CARE_PROVIDER_SITE_OTHER): Payer: BC Managed Care – PPO | Admitting: Family Medicine

## 2014-02-18 VITALS — BP 110/80 | Temp 98.1°F | Ht 72.5 in | Wt 187.0 lb

## 2014-02-18 DIAGNOSIS — Z0000 Encounter for general adult medical examination without abnormal findings: Secondary | ICD-10-CM

## 2014-02-18 DIAGNOSIS — F4321 Adjustment disorder with depressed mood: Secondary | ICD-10-CM

## 2014-02-18 DIAGNOSIS — F329 Major depressive disorder, single episode, unspecified: Secondary | ICD-10-CM

## 2014-02-18 MED ORDER — CITALOPRAM HYDROBROMIDE 20 MG PO TABS: ORAL_TABLET | ORAL | Status: DC

## 2014-02-18 NOTE — Progress Notes (Signed)
   Subjective:    Patient ID: Vincent [name], male    DOB: [birth year], 53 y.o.   MRN: [id number]  HPI Vincent [name] is a delightful 53 year old married male nonsmoker who comes in today for general physical examination  We saw him recently with episode of low back pain. It's resolve with rest exercise medication physical therapy. He's doing his exercises daily. 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   HENT: Negative.   Eyes: Negative.   Respiratory: Negative.   Cardiovascular: Negative.   Gastrointestinal: Negative.   Endocrine: Negative.   Genitourinary: Negative.   Musculoskeletal: Negative.   Skin: Negative.   Allergic/Immunologic: Negative.   Neurological: Negative.   Hematological: Negative.   Psychiatric/Behavioral: Negative.        Objective:   Physical Exam  Constitutional: He is oriented to person, place, and time. He appears well-developed and well-nourished.  HENT:  Head: Normocephalic and atraumatic.  Right Ear: External ear normal.  Left Ear: External ear normal.  Nose: Nose normal.  Mouth/Throat: Oropharynx is clear and moist.  Eyes: Conjunctivae and EOM are normal. Pupils are equal, round, and reactive to light.  Neck: Normal range of motion. Neck supple. No JVD present. No tracheal deviation present. No thyromegaly present.  Cardiovascular: Normal rate, regular rhythm, normal heart sounds and intact distal pulses.  Exam reveals no gallop and no friction rub.   No murmur heard. Pulmonary/Chest: Effort normal and breath sounds normal. No stridor. No respiratory distress. He has no wheezes. He has no rales. He exhibits no tenderness.  Abdominal: Soft. Bowel sounds are normal. He exhibits  no distension and no mass. There is no tenderness. There is no rebound and no guarding.  Genitourinary: Rectum normal, prostate normal and penis normal. Guaiac negative stool. No penile tenderness.  Musculoskeletal: Normal range of motion. He exhibits no edema or tenderness.  Lymphadenopathy:    He has no cervical adenopathy.  Neurological: He is alert and oriented to person, place, and time. He has normal reflexes. No cranial nerve deficit. He exhibits normal muscle tone.  Skin: Skin is warm and dry. No rash noted. No erythema. No pallor.  Total body skin exam normal  Psychiatric: He has a normal mood and affect. His behavior is normal. Judgment and thought content normal.          Assessment & Plan:  Healthy male  Low back pain... Resolved......Marland Kitchen

## 2014-02-18 NOTE — Progress Notes (Signed)
Pre visit review using our clinic review tool, if applicable. No additional management support is needed unless otherwise documented below in the visit note. 

## 2014-02-18 NOTE — Patient Instructions (Signed)
Continue the Celexa 20 mg daily....... he could also try cutting the dose in half for couple months to see how you feel  Return in one year sooner if any problems  Remember to do your daily exercises

## 2014-02-23 ENCOUNTER — Emergency Department (HOSPITAL_COMMUNITY): Payer: BC Managed Care – PPO

## 2014-02-23 ENCOUNTER — Encounter (HOSPITAL_COMMUNITY): Payer: Self-pay | Admitting: Emergency Medicine

## 2014-02-23 ENCOUNTER — Inpatient Hospital Stay (HOSPITAL_COMMUNITY)
Admission: EM | Admit: 2014-02-23 | Discharge: 2014-02-26 | DRG: 483 | Disposition: A | Discharge status: Home Health | Payer: BC Managed Care – PPO | Attending: General Surgery | Admitting: General Surgery

## 2014-02-23 DIAGNOSIS — Y93H9 Activity, other involving exterior property and land maintenance, building and construction: Secondary | ICD-10-CM

## 2014-02-23 DIAGNOSIS — S52121A Displaced fracture of head of right radius, initial encounter for closed fracture: Secondary | ICD-10-CM | POA: Diagnosis present

## 2014-02-23 DIAGNOSIS — Z79899 Other long term (current) drug therapy: Secondary | ICD-10-CM | POA: Diagnosis not present

## 2014-02-23 DIAGNOSIS — Z419 Encounter for procedure for purposes other than remedying health state, unspecified: Secondary | ICD-10-CM

## 2014-02-23 DIAGNOSIS — W11XXXA Fall on and from ladder, initial encounter: Secondary | ICD-10-CM | POA: Diagnosis present

## 2014-02-23 DIAGNOSIS — W19XXXA Unspecified fall, initial encounter: Secondary | ICD-10-CM

## 2014-02-23 DIAGNOSIS — D62 Acute posthemorrhagic anemia: Secondary | ICD-10-CM | POA: Diagnosis not present

## 2014-02-23 DIAGNOSIS — S32011A Stable burst fracture of first lumbar vertebra, initial encounter for closed fracture: Secondary | ICD-10-CM | POA: Diagnosis present

## 2014-02-23 DIAGNOSIS — S42201A Unspecified fracture of upper end of right humerus, initial encounter for closed fracture: Secondary | ICD-10-CM | POA: Diagnosis present

## 2014-02-23 DIAGNOSIS — W1789XA Other fall from one level to another, initial encounter: Secondary | ICD-10-CM

## 2014-02-23 DIAGNOSIS — M79601 Pain in right arm: Secondary | ICD-10-CM | POA: Diagnosis present

## 2014-02-23 DIAGNOSIS — R159 Full incontinence of feces: Secondary | ICD-10-CM | POA: Diagnosis present

## 2014-02-23 DIAGNOSIS — S32591A Other specified fracture of right pubis, initial encounter for closed fracture: Secondary | ICD-10-CM | POA: Diagnosis present

## 2014-02-23 DIAGNOSIS — S42209A Unspecified fracture of upper end of unspecified humerus, initial encounter for closed fracture: Secondary | ICD-10-CM | POA: Diagnosis present

## 2014-02-23 DIAGNOSIS — S42211A Unspecified displaced fracture of surgical neck of right humerus, initial encounter for closed fracture: Secondary | ICD-10-CM | POA: Diagnosis present

## 2014-02-23 DIAGNOSIS — S32599A Other specified fracture of unspecified pubis, initial encounter for closed fracture: Secondary | ICD-10-CM | POA: Diagnosis present

## 2014-02-23 DIAGNOSIS — S32019A Unspecified fracture of first lumbar vertebra, initial encounter for closed fracture: Secondary | ICD-10-CM

## 2014-02-23 LAB — CBC WITH DIFFERENTIAL/PLATELET
BASOS ABS: 0 10*3/uL (ref 0.0–0.1)
BASOS PCT: 0 % (ref 0–1)
EOS ABS: 0 10*3/uL (ref 0.0–0.7)
EOS PCT: 0 % (ref 0–5)
HEMATOCRIT: 39.7 % (ref 39.0–52.0)
Hemoglobin: 13.7 g/dL (ref 13.0–17.0)
Lymphocytes Relative: 11 % — ABNORMAL LOW (ref 12–46)
Lymphs Abs: 1.9 10*3/uL (ref 0.7–4.0)
MCH: 30.7 pg (ref 26.0–34.0)
MCHC: 34.5 g/dL (ref 30.0–36.0)
MCV: 89 fL (ref 78.0–100.0)
MONO ABS: 1.2 10*3/uL — AB (ref 0.1–1.0)
Monocytes Relative: 7 % (ref 3–12)
Neutro Abs: 13.3 10*3/uL — ABNORMAL HIGH (ref 1.7–7.7)
Neutrophils Relative %: 82 % — ABNORMAL HIGH (ref 43–77)
PLATELETS: 207 10*3/uL (ref 150–400)
RBC: 4.46 MIL/uL (ref 4.22–5.81)
RDW: 13.1 % (ref 11.5–15.5)
WBC: 16.4 10*3/uL — ABNORMAL HIGH (ref 4.0–10.5)

## 2014-02-23 LAB — COMPREHENSIVE METABOLIC PANEL
ALT: 24 U/L (ref 0–53)
AST: 32 U/L (ref 0–37)
Albumin: 4 g/dL (ref 3.5–5.2)
Alkaline Phosphatase: 55 U/L (ref 39–117)
Anion gap: 16 — ABNORMAL HIGH (ref 5–15)
BUN: 16 mg/dL (ref 6–23)
CALCIUM: 9.2 mg/dL (ref 8.4–10.5)
CO2: 22 mEq/L (ref 19–32)
Chloride: 101 mEq/L (ref 96–112)
Creatinine, Ser: 1.17 mg/dL (ref 0.50–1.35)
GFR calc Af Amer: 82 mL/min — ABNORMAL LOW (ref >90)
GFR, EST NON AFRICAN AMERICAN: 71 mL/min — AB (ref >90)
Glucose, Bld: 136 mg/dL — ABNORMAL HIGH (ref 70–99)
Potassium: 3.6 mEq/L — ABNORMAL LOW (ref 3.7–5.3)
SODIUM: 139 meq/L (ref 137–147)
TOTAL PROTEIN: 6.7 g/dL (ref 6.0–8.3)
Total Bilirubin: 1.5 mg/dL — ABNORMAL HIGH (ref 0.3–1.2)

## 2014-02-23 MED ORDER — HYDROMORPHONE HCL 1 MG/ML IJ SOLN
0.5000 mg | Freq: Once | INTRAMUSCULAR | Status: AC
  Administered 2014-02-23: 0.5 mg via INTRAVENOUS
  Filled 2014-02-23: qty 1

## 2014-02-23 MED ORDER — HYDROMORPHONE HCL 1 MG/ML IJ SOLN
1.0000 mg | Freq: Once | INTRAMUSCULAR | Status: AC
  Administered 2014-02-23: 1 mg via INTRAVENOUS
  Filled 2014-02-23: qty 1

## 2014-02-23 MED ORDER — HYDROMORPHONE HCL 1 MG/ML IJ SOLN
0.5000 mg | Freq: Once | INTRAMUSCULAR | Status: AC
Start: 2014-02-23 — End: 2014-02-23
  Administered 2014-02-23: 0.5 mg via INTRAVENOUS
  Filled 2014-02-23: qty 1

## 2014-02-23 MED ORDER — TETANUS-DIPHTH-ACELL PERTUSSIS 5-2.5-18.5 LF-MCG/0.5 IM SUSP: 0.5000 mL | Freq: Once | INTRAMUSCULAR | Status: DC

## 2014-02-23 MED ORDER — IOHEXOL 300 MG/ML  SOLN
100.0000 mL | Freq: Once | INTRAMUSCULAR | Status: AC | PRN
  Administered 2014-02-23: 100 mL via INTRAVENOUS

## 2014-02-23 NOTE — ED Notes (Signed)
Spoke with CT regarding pt.  Stated pt was next to be picked up.

## 2014-02-23 NOTE — ED Notes (Signed)
Dr. Betsey Holiday at bedside discussing plan of care with pt.

## 2014-02-23 NOTE — ED Notes (Signed)
Pt continuing to deny any numbness or tingling or incontinence during rounding.  Pt moving lower extremity with no difficulty.  Still reporting pain to right shoulder and lower back; sts back feels "sore".

## 2014-02-23 NOTE — ED Notes (Signed)
Per EMS, Pt had a fall from approximately 20 feet while cleaning out his gutters. Pt fell onto his right shoulder and has an obvious deformity. Pt denies LOC. Pt report lower back stiffness.

## 2014-02-23 NOTE — Consult Note (Signed)
Reason for Consult:Trauma with L1 fracture Referring Physician: Trauma MD  Jarone Ostergaard is an 53 y.o. male.  HPI: 53 yo male fell from ladder and came to ED for eval. Found to have L1 fracture with mild retropuylsion, but no neuro issues. Neurosurgical consult requested.  Past Medical History  Diagnosis Date  . Skin cancer     right shoulder    Past Surgical History  Procedure Laterality Date  . Skin cancer excision      right shoulder, removed by Dr Tonia Brooms  . Cyst removal hand      left hand/benign    Family History  Problem Relation Age of Onset  . Stroke Mother   . Diverticulitis Brother   . Hernia Brother   . Colon cancer Neg Hx   . Rectal cancer Neg Hx   . Stomach cancer Neg Hx     Social History:  reports that he has never smoked. He has never used smokeless tobacco. He reports that he drinks about 1.8 oz of alcohol per week. He reports that he does not use illicit drugs.  Allergies: No Known Allergies  Medications: I have reviewed the patient's current medications.  Results for orders placed or performed during the hospital encounter of 02/23/14 (from the past 48 hour(s))  CBC with Differential     Status: Abnormal   Collection Time: 02/23/14  1:37 PM  Result Value Ref Range   WBC 16.4 (H) 4.0 - 10.5 K/uL   RBC 4.46 4.22 - 5.81 MIL/uL   Hemoglobin 13.7 13.0 - 17.0 g/dL   HCT 39.7 39.0 - 52.0 %   MCV 89.0 78.0 - 100.0 fL   MCH 30.7 26.0 - 34.0 pg   MCHC 34.5 30.0 - 36.0 g/dL   RDW 13.1 11.5 - 15.5 %   Platelets 207 150 - 400 K/uL   Neutrophils Relative % 82 (H) 43 - 77 %   Neutro Abs 13.3 (H) 1.7 - 7.7 K/uL   Lymphocytes Relative 11 (L) 12 - 46 %   Lymphs Abs 1.9 0.7 - 4.0 K/uL   Monocytes Relative 7 3 - 12 %   Monocytes Absolute 1.2 (H) 0.1 - 1.0 K/uL   Eosinophils Relative 0 0 - 5 %   Eosinophils Absolute 0.0 0.0 - 0.7 K/uL   Basophils Relative 0 0 - 1 %   Basophils Absolute 0.0 0.0 - 0.1 K/uL  Comprehensive metabolic panel     Status: Abnormal    Collection Time: 02/23/14  1:37 PM  Result Value Ref Range   Sodium 139 137 - 147 mEq/L   Potassium 3.6 (L) 3.7 - 5.3 mEq/L   Chloride 101 96 - 112 mEq/L   CO2 22 19 - 32 mEq/L   Glucose, Bld 136 (H) 70 - 99 mg/dL   BUN 16 6 - 23 mg/dL   Creatinine, Ser 1.17 0.50 - 1.35 mg/dL   Calcium 9.2 8.4 - 10.5 mg/dL   Total Protein 6.7 6.0 - 8.3 g/dL   Albumin 4.0 3.5 - 5.2 g/dL   AST 32 0 - 37 U/L   ALT 24 0 - 53 U/L   Alkaline Phosphatase 55 39 - 117 U/L   Total Bilirubin 1.5 (H) 0.3 - 1.2 mg/dL   GFR calc non Af Amer 71 (L) >90 mL/min   GFR calc Af Amer 82 (L) >90 mL/min    Comment: (NOTE) The eGFR has been calculated using the CKD EPI equation. This calculation has not been validated in all clinical  situations. eGFR's persistently <90 mL/min signify possible Chronic Kidney Disease.    Anion gap 16 (H) 5 - 15    Dg Chest 2 View  02/23/2014   CLINICAL DATA:  Fall from ladder 20 feet landing on right side with pain, initial encounter  EXAM: CHEST  2 VIEW  COMPARISON:  None.  FINDINGS: The heart size and mediastinal contours are within normal limits. Both lungs are clear. The visualized skeletal structures are unremarkable.  IMPRESSION: No active cardiopulmonary disease.   Electronically Signed   By: Inez Catalina M.D.   On: 02/23/2014 15:41   Dg Thoracic Spine W/swimmers  02/23/2014   CLINICAL DATA:  Fall 20 feet from ladder, initial encounter  EXAM: THORACIC SPINE - 2 VIEW + SWIMMERS  COMPARISON:  None.  FINDINGS: Compression deformity of L1 is again noted. No other compression deformities are seen. No gross soft tissue abnormality is noted.  IMPRESSION: L1 compression deformity. No other compression deformities are noted.   Electronically Signed   By: Inez Catalina M.D.   On: 02/23/2014 15:51   Dg Lumbar Spine 2-3 Views  02/23/2014   CLINICAL DATA:  Fall from ladder 2015 with back pain, initial encounter  EXAM: LUMBAR SPINE - 2-3 VIEW  COMPARISON:  None.  FINDINGS: Compression deformity  of L1 is noted with approximately 50% vertebral body height loss anteriorly. No significant retropulsion is identified on these images. No other compression fracture is seen.  IMPRESSION: L1 compression deformity   Electronically Signed   By: Inez Catalina M.D.   On: 02/23/2014 15:50   Dg Pelvis 1-2 Views  02/23/2014   CLINICAL DATA:  Fall from ladder 2015, initial encounter  EXAM: PELVIS - 1-2 VIEW  COMPARISON:  None.  FINDINGS: There is no evidence of pelvic fracture or diastasis. No pelvic bone lesions are seen.  IMPRESSION: No acute abnormality noted.   Electronically Signed   By: Inez Catalina M.D.   On: 02/23/2014 15:50   Dg Shoulder Right  02/23/2014   CLINICAL DATA:  From ladder 20 feet with right arm pain, initial encounter  EXAM: RIGHT SHOULDER - 2+ VIEW  COMPARISON:  None.  FINDINGS: There is a fracture through the surgical neck of the humeral head with mild impaction identified. The scapula appears within normal limits. The underlying ribs are within normal limits.  IMPRESSION: Proximal right humeral fracture   Electronically Signed   By: Inez Catalina M.D.   On: 02/23/2014 15:46   Dg Elbow 2 Views Right  02/23/2014   CLINICAL DATA:  Fall from ladder 20 feet with elbow pain, initial encounter  EXAM: RIGHT ELBOW - 2 VIEW  COMPARISON:  None.  FINDINGS: There is a fracture through the radial head with the impaction and rotation of 1 of the fracture fragments out of the joint laterally. Joint effusion is noted.  IMPRESSION: Proximal radial head fracture with associated joint effusion.   Electronically Signed   By: Inez Catalina M.D.   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  Pain  EXAM: CT HEAD WITHOUT CONTRAST  CT CERVICAL SPINE WITHOUT CONTRAST  TECHNIQUE: Multidetector CT imaging of the head and cervical spine was performed following the standard protocol without intravenous contrast. Multiplanar CT image  reconstructions of the cervical spine were also generated.  COMPARISON:  None.  FINDINGS: CT HEAD FINDINGS  The ventricles are normal in size and configuration. There is  no mass, hemorrhage, extra-axial fluid collection, or midline shift. Gray-white compartments are normal. No acute infarct apparent.  On axial slice 15 series 3, there is a linear lucency in the medial occipital bone which does not traverse the entire DI the occipital bone. It is questioned whether this lucency represents a prominent vascular groove versus an incomplete nondisplaced fracture. There is no displaced fracture. Mastoid air cells are clear.  CT CERVICAL SPINE FINDINGS  There is no cervical spine region fracture or spondylolisthesis. Prevertebral soft tissues and predental space regions are normal. There is mild disc space narrowing at C5-6. There is a small anterior osteophyte along the inferior aspect of the C5 vertebral body. There is facet hypertrophy at several levels bilaterally. There is a calcified left paracentral disc protrusion at C5-6 on the left which abuts the cord but does not cause significant impression on the cord. There is no other significant disc protrusion. No spinal stenosis.  IMPRESSION: CT head: Question vascular prominence versus incomplete fracture medial right occipital bone mid inferior portion. If patient is tender focally in this area, would advise considering this lucency to represent a nondisplaced incomplete fracture. No displaced fracture. No intracranial mass, hemorrhage, or extra-axial fluid. The gray-white compartments appear normal.  CT cervical spine: No fracture or spondylolisthesis in the cervical region. Asymmetric calcified disc protrusion on the left at C5-6. Facet hypertrophy at several levels. No frank spinal stenosis.   Electronically Signed   By: Lowella Grip M.D.   On: 02/23/2014 15:03   Ct Cervical Spine Wo Contrast  02/23/2014   CLINICAL DATA:  Patient fell 20 feet while cleaning  gutters.  Pain  EXAM: CT HEAD WITHOUT CONTRAST  CT CERVICAL SPINE WITHOUT CONTRAST  TECHNIQUE: Multidetector CT imaging of the head and cervical spine was performed following the standard protocol without intravenous contrast. Multiplanar CT image reconstructions of the cervical spine were also generated.  COMPARISON:  None.  FINDINGS: CT HEAD FINDINGS  The ventricles are normal in size and configuration. There is no mass, hemorrhage, extra-axial fluid collection, or midline shift. Gray-white compartments are normal. No acute infarct apparent.  On axial slice 15 series 3, there is a linear lucency in the medial occipital bone which does not traverse the entire DI the occipital bone. It is questioned whether this lucency represents a prominent vascular groove versus an incomplete nondisplaced fracture. There is no displaced fracture. Mastoid air cells are clear.  CT CERVICAL SPINE FINDINGS  There is no cervical spine region fracture or spondylolisthesis. Prevertebral soft tissues and predental space regions are normal. There is mild disc space narrowing at C5-6. There is a small anterior osteophyte along the inferior aspect of the C5 vertebral body. There is facet hypertrophy at several levels bilaterally. There is a calcified left paracentral disc protrusion at C5-6 on the left which abuts the cord but does not cause significant impression on the cord. There is no other significant disc protrusion. No spinal stenosis.  IMPRESSION: CT head: Question vascular prominence versus incomplete fracture medial right occipital bone mid inferior portion. If patient is tender focally in this area, would advise considering this lucency to represent a nondisplaced incomplete fracture. No displaced fracture. No intracranial mass, hemorrhage, or extra-axial fluid. The gray-white compartments appear normal.  CT cervical spine: No fracture or spondylolisthesis in the cervical region. Asymmetric calcified disc protrusion on the left  at C5-6. Facet hypertrophy at several levels. No frank spinal stenosis.   Electronically Signed   By: Lowella Grip M.D.  On: 02/23/2014 15:03   Ct Thoracic Spine Wo Contrast  02/23/2014   CLINICAL DATA:  Fall 20 feet while cleaning on gutters. Right proximal humeral fracture. Back pain  EXAM: CT THORACIC AND LUMBAR SPINE WITHOUT CONTRAST  TECHNIQUE: Multidetector CT imaging of the thoracic and lumbar spine was performed without contrast. Multiplanar CT image reconstructions were also generated.  COMPARISON:  Chest radiograph of 04/26/2013  FINDINGS: CT THORACIC SPINE FINDINGS  Small hiatal hernia noted.  No thoracic spine fracture or malalignment identified. Mild spurring anterior to the vertebral column at the T3-4 and T4-5 levels. No significant bony foraminal impingement.  CT LUMBAR SPINE FINDINGS  Acute 50% compression fracture of L1 observed with 7 mm posterior bony retropulsion the, resulting in mild central stenosis. No discrete foraminal impingement. No extension of the fracture into the posterior elements is observed. Paraspinal edema noted.  Transitional S1. Facet arthropathy and grade 1 anterolisthesis at L5-S1 along with an underlying disc bulge contribute to bilateral mild foraminal stenosis.  2 mm right mid kidney nonobstructive renal calculus.  IMPRESSION: 1. 50% compression fracture of L1 vertebral body with 7 mm of posterior bony retropulsion causing mild central stenosis. No posterior element involvement. Paraspinal edema noted. 2. Facet arthropathy in anterolisthesis at L5-S1 causing bilateral mild foraminal stenosis. 3. 2 mm right mid kidney nonobstructive renal calculus. 4. Small hiatal hernia. 5. No thoracic spine fracture identified. Minimal spurring anterior to the vertebral column at T3-4 and T4-5.   Electronically Signed   By: Sherryl Barters M.D.   On: 02/23/2014 19:12   Ct Lumbar Spine Wo Contrast  02/23/2014   CLINICAL DATA:  Fall 20 feet while cleaning on gutters. Right  proximal humeral fracture. Back pain  EXAM: CT THORACIC AND LUMBAR SPINE WITHOUT CONTRAST  TECHNIQUE: Multidetector CT imaging of the thoracic and lumbar spine was performed without contrast. Multiplanar CT image reconstructions were also generated.  COMPARISON:  Chest radiograph of 04/26/2013  FINDINGS: CT THORACIC SPINE FINDINGS  Small hiatal hernia noted.  No thoracic spine fracture or malalignment identified. Mild spurring anterior to the vertebral column at the T3-4 and T4-5 levels. No significant bony foraminal impingement.  CT LUMBAR SPINE FINDINGS  Acute 50% compression fracture of L1 observed with 7 mm posterior bony retropulsion the, resulting in mild central stenosis. No discrete foraminal impingement. No extension of the fracture into the posterior elements is observed. Paraspinal edema noted.  Transitional S1. Facet arthropathy and grade 1 anterolisthesis at L5-S1 along with an underlying disc bulge contribute to bilateral mild foraminal stenosis.  2 mm right mid kidney nonobstructive renal calculus.  IMPRESSION: 1. 50% compression fracture of L1 vertebral body with 7 mm of posterior bony retropulsion causing mild central stenosis. No posterior element involvement. Paraspinal edema noted. 2. Facet arthropathy in anterolisthesis at L5-S1 causing bilateral mild foraminal stenosis. 3. 2 mm right mid kidney nonobstructive renal calculus. 4. Small hiatal hernia. 5. No thoracic spine fracture identified. Minimal spurring anterior to the vertebral column at T3-4 and T4-5.   Electronically Signed   By: Sherryl Barters M.D.   On: 02/23/2014 19:12   Ct Abdomen Pelvis W Contrast  02/23/2014   CLINICAL DATA:  53 year old male with history of trauma from a fall approximately 20 feet while cleaning out gutters. Fell onto right shoulder with obvious right shoulder deformity. Lower back stiffness.  EXAM: CT ABDOMEN AND PELVIS WITH CONTRAST  TECHNIQUE: Multidetector CT imaging of the abdomen and pelvis was  performed using the standard protocol following bolus administration  of intravenous contrast.  CONTRAST:  113m OMNIPAQUE IOHEXOL 300 MG/ML  SOLN  COMPARISON:  CT of the abdomen and pelvis 04/27/2011.  FINDINGS: Lower chest:  Small hiatal hernia.  Otherwise, unremarkable.  Hepatobiliary: 6 mm low attenuation lesion in segment 8 of the liver is too small to characterize, but unchanged compared to prior study 04/27/2011, presumably a tiny cyst. No other suspicious appearing cystic or solid hepatic lesion. No signs of acute trauma to the liver. No intra or extrahepatic biliary ductal dilatation. Gallbladder is normal in appearance.  Pancreas: Unremarkable.  Spleen: No signs of acute trauma to the spleen.  Unremarkable.  Adrenals/Urinary Tract: Bilateral adrenal glands and bilateral kidneys are normal in appearance. Specifically, no signs of acute trauma to either kidney. No hydroureteronephrosis. Urinary bladder is normal in appearance.  Stomach/Bowel: Normal appearance of the stomach. No pathologic dilatation of small bowel or colon. A few scattered colonic diverticulae are noted, without surrounding inflammatory changes to suggest an acute diverticulitis at this time. Normal appendix. No definite signs of acute trauma to the bowel or mesentery.  Vascular/Lymphatic: No evidence of an acute traumatic injury to the major abdominal or pelvic arteries. No significant atherosclerotic disease or aneurysm. No lymphadenopathy identified in the abdomen or pelvis.  Reproductive: Prostate gland and seminal vesicles are unremarkable in appearance.  Other: No high attenuation fluid collection within in the peritoneal cavity or retroperitoneum to suggest significant posttraumatic hemorrhage. No significant volume of ascites. No pneumoperitoneum.  Musculoskeletal: There is an acute burst fracture of L1 with 4 mm of retropulsion of posterior fracture fragments which slightly narrows the central spinal canal, and approximately 60%  loss of anterior vertebral body height. There are also nondisplaced fractures through the right superior and inferior pubic rami, the superior pubic ramus fracture is extremely subtle (noted only on coronal reformats). There are no aggressive appearing lytic or blastic lesions noted in the visualized portions of the skeleton.  IMPRESSION: 1. Acute burst fracture of L1 with approximately 60% loss of anterior vertebral body height and 4 mm of retropulsion of fracture fragments which slightly narrows the central spinal canal. 2. Nondisplaced fractures of the right superior and inferior pubic rami. 3. No other findings of significant acute traumatic injury to the abdomen or pelvis. 4. Colonic diverticulosis without findings to suggest acute diverticulitis at this time. These results were called by telephone at the time of interpretation on 02/23/2014 at 7:23 pm to Dr. CMelchor Amour who verbally acknowledged these results.   Electronically Signed   By: DVinnie LangtonM.D.   On: 02/23/2014 19:25   Ct Shoulder Right Wo Contrast  02/23/2014   CLINICAL DATA:  Right shoulder deformity after falling 20 feet while cleaning gutters.  EXAM: CT OF THE RIGHT SHOULDER WITHOUT CONTRAST  TECHNIQUE: Multidetector CT imaging was performed according to the standard protocol. Multiplanar CT image reconstructions were also generated.  COMPARISON:  Radiographs of same day.  FINDINGS: Mildly comminuted oblique fracture is seen involving the proximal left humeral neck with moderate anterior displacement of the humeral shaft relative to the humeral head. This appears to be closed and posttraumatic. Visualized ribs and scapula appear normal. The clavicle appears normal. Glenohumeral and acromioclavicular joints appear normal. Visualized portions of right lung appear normal.  IMPRESSION: Mildly comminuted and moderately displaced oblique fracture of the proximal left humeral neck is noted. This is the initial encounter.   Electronically Signed    By: JSabino DickM.D.   On: 02/23/2014 18:44   Ct Elbow Right W/o  Cm  02/23/2014   CLINICAL DATA:  Acute right elbow pain after falling 20 feet cleaning gutters.  EXAM: CT OF THE RIGHT ELBOW WITHOUT CONTRAST  TECHNIQUE: Multidetector CT imaging was performed according to the standard protocol. Multiplanar CT image reconstructions were also generated.  COMPARISON:  None.  FINDINGS: The visualized portion of the distal humerus appears normal. Probable minimally displaced fracture of coronoid process of proximal ulna is noted. The olecranon appears normal. However, moderately comminuted fracture is seen involving the radial head with moderate posterior displacement of the largest fracture fragment. This fragment also appears to be rotated.  IMPRESSION: Comminuted fracture of the proximal right radial head is noted with moderate displacement and rotation of largest fracture fragment. Probable minimally displaced fracture involving coronoid process of proximal ulna.   Electronically Signed   By: Sabino Dick M.D.   On: 02/23/2014 19:11   Dg Humerus Right  02/23/2014   CLINICAL DATA:  Fall from ladder 20 feet, initial encounter  EXAM: RIGHT HUMERUS - 2+ VIEW  COMPARISON:  None.  FINDINGS: There is again noted a fracture through the surgical neck of the proximal humerus. Additionally there is fracture identified along the distal aspect of the humerus which appears to arise from the radial head. The fractures rotated out of the joint laterally.  IMPRESSION: Proximal right humeral fracture as well as the right radial head fracture with rotation of the fracture fragment out of the joint.   Electronically Signed   By: Inez Catalina M.D.   On: 02/23/2014 15:48    A comprehensive review of systems was negative. Blood pressure 109/62, pulse 82, temperature 98 F (36.7 C), temperature source Oral, resp. rate 18, height '6\' 1"'  (1.854 m), weight 81.647 kg (180 lb), SpO2 100 %. awake, alert, oriented. No numbess or  weakness noted. Reflexes normal.  Assessment/Plan: CT reviewed and shows significant body fracture of L1. Minimal retropulsion of bone without significant stenosis. No involvement of middle or posterior columns. Should be stable fracture, and hopefully will heal with TLSO. This should be ordered in the am and he can start to mobilize once it is available. Will follow while hospitalized.  Faythe Ghee, MD 02/23/2014, 8:30 PM

## 2014-02-23 NOTE — H&P (Signed)
Chief Complaint  Patient presents with  . Fall  . Shoulder Pain    Referring MD: Dr. Betsey Holiday  HISTORY:  Pt is a 54 -year-old male who presents to the emergency department after falling approximately 20 feet from the roof while cleaning gutters. He immediately complained of right arm pain and back pain. He was found to have a proximal humerus fracture as well as proximal radius and ulnar fracture. Dr. Tamera Punt orthopedics has seen the patient and evaluated him. Because of this as back pain, he also underwent CT scans of the spine as well as the abdomen and pelvis. He was found to have a L1 burst fracture with some loss of height and some retropulsion of fragments.  He also has right superior and inferior pubic ramus fractures.  He denies nausea. He denies loss of consciousness. He is not having any chest pain. Chest x-ray was performed and was negative.  He reportedly had some incontinence with the fall but had good rectal tone upon arrival to the emergency department and has no neurovascular compromise in his lower extremities. He also reports no radicular pain.  Past Medical History  Diagnosis Date  . Skin cancer     right shoulder    Past Surgical History  Procedure Laterality Date  . Skin cancer excision      right shoulder, removed by Dr Tonia Brooms  . Cyst removal hand      left hand/benign    No current facility-administered medications for this encounter.   Current Outpatient Prescriptions  Medication Sig Dispense Refill  . citalopram (CELEXA) 20 MG tablet 1 tab each bedtime (Patient taking differently: Take 20 mg by mouth at bedtime. ) 100 tablet 3  . Multiple Vitamins-Minerals (ONE-A-DAY 50 PLUS PO) Take by mouth daily.       No Known Allergies   Family History  Problem Relation Age of Onset  . Stroke Mother   . Diverticulitis Brother   . Hernia Brother   . Colon cancer Neg Hx   . Rectal cancer Neg Hx   . Stomach cancer Neg Hx      History   Social History  .  Marital Status: Single    Spouse Name: N/A    Number of Children: N/A  . Years of Education: N/A   Social History Main Topics  . Smoking status: Never Smoker   . Smokeless tobacco: Never Used  . Alcohol Use: 1.8 oz/week    3 Glasses of wine per week  . Drug Use: No  . Sexual Activity: None   Other Topics Concern  . None   Social History Narrative     REVIEW OF SYSTEMS - PERTINENT POSITIVES ONLY: 12 point review of systems negative other than HPI and PMH   EXAM: Filed Vitals:   02/23/14 2100  BP: 135/78  Pulse: 76  Temp:   Resp:     Wt Readings from Last 3 Encounters:  02/23/14 81.647 kg (180 lb)  02/18/14 84.823 kg (187 lb)  12/30/13 83.915 kg (185 lb)     Gen:  No acute distress.  Well nourished and well groomed.   Neurological: Alert and oriented to person, place, and time. Coordination normal.  Head: Normocephalic and atraumatic.  Eyes: Conjunctivae are normal. Pupils are equal, round, and reactive to light. No scleral icterus.  Neck: Normal range of motion. Neck supple. No tracheal deviation or thyromegaly present.  Cardiovascular: Normal rate, regular rhythm,  and intact distal pulses.   Respiratory: Effort normal.  No respiratory distress. No chest wall tenderness.  GI: Soft. Bowel sounds are normal. The abdomen is soft and nontender.  There is no rebound and no guarding.  Musculoskeletal: Normal range of motion in left arm and bilateral legs.  The right arm is swollen at the proximal humerus and elbow.   Lymphadenopathy: No cervical, preauricular, postauricular or axillary adenopathy is present Skin: Skin is warm and dry. No rash noted. No diaphoresis. No erythema. No pallor. No clubbing, cyanosis, or edema.   Psychiatric: Normal mood and affect. Behavior is normal. Judgment and thought content normal.    LABORATORY RESULTS: Available labs are reviewed   Recent Results (from the past 2160 hour(s))  CBC with Differential     Status: None   Collection  Time: 02/11/14  9:27 AM  Result Value Ref Range   WBC 5.7 4.0 - 10.5 K/uL   RBC 4.67 4.22 - 5.81 Mil/uL   Hemoglobin 14.4 13.0 - 17.0 g/dL   HCT 42.8 39.0 - 52.0 %   MCV 91.8 78.0 - 100.0 fl   MCHC 33.6 30.0 - 36.0 g/dL   RDW 13.6 11.5 - 15.5 %   Platelets 209.0 150.0 - 400.0 K/uL   Neutrophils Relative % 56.4 43.0 - 77.0 %   Lymphocytes Relative 33.1 12.0 - 46.0 %   Monocytes Relative 8.6 3.0 - 12.0 %   Eosinophils Relative 1.3 0.0 - 5.0 %   Basophils Relative 0.6 0.0 - 3.0 %   Neutro Abs 3.2 1.4 - 7.7 K/uL   Lymphs Abs 1.9 0.7 - 4.0 K/uL   Monocytes Absolute 0.5 0.1 - 1.0 K/uL   Eosinophils Absolute 0.1 0.0 - 0.7 K/uL   Basophils Absolute 0.0 0.0 - 0.1 K/uL  Comprehensive metabolic panel     Status: Abnormal   Collection Time: 02/11/14  9:27 AM  Result Value Ref Range   Sodium 138 135 - 145 mEq/L   Potassium 3.9 3.5 - 5.1 mEq/L   Chloride 105 96 - 112 mEq/L   CO2 26 19 - 32 mEq/L   Glucose, Bld 98 70 - 99 mg/dL   BUN 21 6 - 23 mg/dL   Creatinine, Ser 1.0 0.4 - 1.5 mg/dL   Total Bilirubin 1.4 (H) 0.2 - 1.2 mg/dL   Alkaline Phosphatase 46 39 - 117 U/L   AST 21 0 - 37 U/L   ALT 20 0 - 53 U/L   Total Protein 6.5 6.0 - 8.3 g/dL   Albumin 4.0 3.5 - 5.2 g/dL   Calcium 8.8 8.4 - 10.5 mg/dL   GFR 80.78 >60.00 mL/min  Lipid panel     Status: Abnormal   Collection Time: 02/11/14  9:27 AM  Result Value Ref Range   Cholesterol 190 0 - 200 mg/dL    Comment: ATP III Classification       Desirable:  < 200 mg/dL               Borderline High:  200 - 239 mg/dL          High:  > = 240 mg/dL   Triglycerides 92.0 0.0 - 149.0 mg/dL    Comment: Normal:  <150 mg/dLBorderline High:  150 - 199 mg/dL   HDL 56.50 >39.00 mg/dL   VLDL 18.4 0.0 - 40.0 mg/dL   LDL Cholesterol 115 (H) 0 - 99 mg/dL   Total CHOL/HDL Ratio 3     Comment:                Men  Women1/2 Average Risk     3.4          3.3Average Risk          5.0          4.42X Average Risk          9.6          7.13X Average Risk           15.0          11.0                       NonHDL 133.50     Comment: NOTE:  Non-HDL goal should be 30 mg/dL higher than patient's LDL goal (i.e. LDL goal of < 70 mg/dL, would have non-HDL goal of < 100 mg/dL)  PSA     Status: None   Collection Time: 02/11/14  9:27 AM  Result Value Ref Range   PSA 0.90 0.10 - 4.00 ng/mL  TSH     Status: None   Collection Time: 02/11/14  9:27 AM  Result Value Ref Range   TSH 0.87 0.35 - 4.50 uIU/mL  POCT urinalysis dipstick     Status: None   Collection Time: 02/11/14 10:02 AM  Result Value Ref Range   Color, UA yellow    Clarity, UA clear    Glucose, UA n    Bilirubin, UA n    Ketones, UA n    Spec Grav, UA 1.015    Blood, UA n    pH, UA 5.5    Protein, UA n    Urobilinogen, UA 0.2    Nitrite, UA n    Leukocytes, UA Negative   CBC with Differential     Status: Abnormal   Collection Time: 02/23/14  1:37 PM  Result Value Ref Range   WBC 16.4 (H) 4.0 - 10.5 K/uL   RBC 4.46 4.22 - 5.81 MIL/uL   Hemoglobin 13.7 13.0 - 17.0 g/dL   HCT 39.7 39.0 - 52.0 %   MCV 89.0 78.0 - 100.0 fL   MCH 30.7 26.0 - 34.0 pg   MCHC 34.5 30.0 - 36.0 g/dL   RDW 13.1 11.5 - 15.5 %   Platelets 207 150 - 400 K/uL   Neutrophils Relative % 82 (H) 43 - 77 %   Neutro Abs 13.3 (H) 1.7 - 7.7 K/uL   Lymphocytes Relative 11 (L) 12 - 46 %   Lymphs Abs 1.9 0.7 - 4.0 K/uL   Monocytes Relative 7 3 - 12 %   Monocytes Absolute 1.2 (H) 0.1 - 1.0 K/uL   Eosinophils Relative 0 0 - 5 %   Eosinophils Absolute 0.0 0.0 - 0.7 K/uL   Basophils Relative 0 0 - 1 %   Basophils Absolute 0.0 0.0 - 0.1 K/uL  Comprehensive metabolic panel     Status: Abnormal   Collection Time: 02/23/14  1:37 PM  Result Value Ref Range   Sodium 139 137 - 147 mEq/L   Potassium 3.6 (L) 3.7 - 5.3 mEq/L   Chloride 101 96 - 112 mEq/L   CO2 22 19 - 32 mEq/L   Glucose, Bld 136 (H) 70 - 99 mg/dL   BUN 16 6 - 23 mg/dL   Creatinine, Ser 1.17 0.50 - 1.35 mg/dL   Calcium 9.2 8.4 - 10.5 mg/dL   Total Protein  6.7 6.0 - 8.3 g/dL   Albumin 4.0 3.5 - 5.2 g/dL   AST 32 0 -  37 U/L   ALT 24 0 - 53 U/L   Alkaline Phosphatase 55 39 - 117 U/L   Total Bilirubin 1.5 (H) 0.3 - 1.2 mg/dL   GFR calc non Af Amer 71 (L) >90 mL/min   GFR calc Af Amer 82 (L) >90 mL/min    Comment: (NOTE) The eGFR has been calculated using the CKD EPI equation. This calculation has not been validated in all clinical situations. eGFR's persistently <90 mL/min signify possible Chronic Kidney Disease.    Anion gap 16 (H) 5 - 15     RADIOLOGY RESULTS: See E-Chart or I-Site for most recent results.  Images and reports are reviewed.  Dg Chest 2 View  02/23/2014   CLINICAL DATA:  Fall from ladder 20 feet landing on right side with pain, initial encounter  EXAM: CHEST  2 VIEW  COMPARISON:  None.  FINDINGS: The heart size and mediastinal contours are within normal limits. Both lungs are clear. The visualized skeletal structures are unremarkable.  IMPRESSION: No active cardiopulmonary disease.   Electronically Signed   By: Inez Catalina M.D.   On: 02/23/2014 15:41   Dg Thoracic Spine W/swimmers  02/23/2014   CLINICAL DATA:  Fall 20 feet from ladder, initial encounter  EXAM: THORACIC SPINE - 2 VIEW + SWIMMERS  COMPARISON:  None.  FINDINGS: Compression deformity of L1 is again noted. No other compression deformities are seen. No gross soft tissue abnormality is noted.  IMPRESSION: L1 compression deformity. No other compression deformities are noted.   Electronically Signed   By: Inez Catalina M.D.   On: 02/23/2014 15:51   Dg Lumbar Spine 2-3 Views  02/23/2014   CLINICAL DATA:  Fall from ladder 2015 with back pain, initial encounter  EXAM: LUMBAR SPINE - 2-3 VIEW  COMPARISON:  None.  FINDINGS: Compression deformity of L1 is noted with approximately 50% vertebral body height loss anteriorly. No significant retropulsion is identified on these images. No other compression fracture is seen.  IMPRESSION: L1 compression deformity   Electronically  Signed   By: Inez Catalina M.D.   On: 02/23/2014 15:50   Dg Pelvis 1-2 Views  02/23/2014   CLINICAL DATA:  Fall from ladder 2015, initial encounter  EXAM: PELVIS - 1-2 VIEW  COMPARISON:  None.  FINDINGS: There is no evidence of pelvic fracture or diastasis. No pelvic bone lesions are seen.  IMPRESSION: No acute abnormality noted.   Electronically Signed   By: Inez Catalina M.D.   On: 02/23/2014 15:50   Dg Shoulder Right  02/23/2014   CLINICAL DATA:  From ladder 20 feet with right arm pain, initial encounter  EXAM: RIGHT SHOULDER - 2+ VIEW  COMPARISON:  None.  FINDINGS: There is a fracture through the surgical neck of the humeral head with mild impaction identified. The scapula appears within normal limits. The underlying ribs are within normal limits.  IMPRESSION: Proximal right humeral fracture   Electronically Signed   By: Inez Catalina M.D.   On: 02/23/2014 15:46   Dg Elbow 2 Views Right  02/23/2014   CLINICAL DATA:  Fall from ladder 20 feet with elbow pain, initial encounter  EXAM: RIGHT ELBOW - 2 VIEW  COMPARISON:  None.  FINDINGS: There is a fracture through the radial head with the impaction and rotation of 1 of the fracture fragments out of the joint laterally. Joint effusion is noted.  IMPRESSION: Proximal radial head fracture with associated joint effusion.   Electronically Signed   By: Inez Catalina  M.D.   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  Pain  EXAM: CT HEAD WITHOUT CONTRAST  CT CERVICAL SPINE WITHOUT CONTRAST  TECHNIQUE: Multidetector CT imaging of the head and cervical spine was performed following the standard protocol without intravenous contrast. Multiplanar CT image reconstructions of the cervical spine were also generated.  COMPARISON:  None.  FINDINGS: CT HEAD FINDINGS  The ventricles are normal in size and configuration. There is no mass, hemorrhage, extra-axial fluid collection, or midline shift.  Gray-white compartments are normal. No acute infarct apparent.  On axial slice 15 series 3, there is a linear lucency in the medial occipital bone which does not traverse the entire DI the occipital bone. It is questioned whether this lucency represents a prominent vascular groove versus an incomplete nondisplaced fracture. There is no displaced fracture. Mastoid air cells are clear.  CT CERVICAL SPINE FINDINGS  There is no cervical spine region fracture or spondylolisthesis. Prevertebral soft tissues and predental space regions are normal. There is mild disc space narrowing at C5-6. There is a small anterior osteophyte along the inferior aspect of the C5 vertebral body. There is facet hypertrophy at several levels bilaterally. There is a calcified left paracentral disc protrusion at C5-6 on the left which abuts the cord but does not cause significant impression on the cord. There is no other significant disc protrusion. No spinal stenosis.  IMPRESSION: CT head: Question vascular prominence versus incomplete fracture medial right occipital bone mid inferior portion. If patient is tender focally in this area, would advise considering this lucency to represent a nondisplaced incomplete fracture. No displaced fracture. No intracranial mass, hemorrhage, or extra-axial fluid. The gray-white compartments appear normal.  CT cervical spine: No fracture or spondylolisthesis in the cervical region. Asymmetric calcified disc protrusion on the left at C5-6. Facet hypertrophy at several levels. No frank spinal stenosis.   Electronically Signed   By: Lowella Grip M.D.   On: 02/23/2014 15:03   Ct Cervical Spine Wo Contrast  02/23/2014   CLINICAL DATA:  Patient fell 20 feet while cleaning gutters.  Pain  EXAM: CT HEAD WITHOUT CONTRAST  CT CERVICAL SPINE WITHOUT CONTRAST  TECHNIQUE: Multidetector CT imaging of the head and cervical spine was performed following the standard protocol without intravenous contrast. Multiplanar  CT image reconstructions of the cervical spine were also generated.  COMPARISON:  None.  FINDINGS: CT HEAD FINDINGS  The ventricles are normal in size and configuration. There is no mass, hemorrhage, extra-axial fluid collection, or midline shift. Gray-white compartments are normal. No acute infarct apparent.  On axial slice 15 series 3, there is a linear lucency in the medial occipital bone which does not traverse the entire DI the occipital bone. It is questioned whether this lucency represents a prominent vascular groove versus an incomplete nondisplaced fracture. There is no displaced fracture. Mastoid air cells are clear.  CT CERVICAL SPINE FINDINGS  There is no cervical spine region fracture or spondylolisthesis. Prevertebral soft tissues and predental space regions are normal. There is mild disc space narrowing at C5-6. There is a small anterior osteophyte along the inferior aspect of the C5 vertebral body. There is facet hypertrophy at several levels bilaterally. There is a calcified left paracentral disc protrusion at C5-6 on the left which abuts the cord but does not cause significant impression on the cord. There is no other significant disc protrusion. No spinal stenosis.  IMPRESSION:  CT head: Question vascular prominence versus incomplete fracture medial right occipital bone mid inferior portion. If patient is tender focally in this area, would advise considering this lucency to represent a nondisplaced incomplete fracture. No displaced fracture. No intracranial mass, hemorrhage, or extra-axial fluid. The gray-white compartments appear normal.  CT cervical spine: No fracture or spondylolisthesis in the cervical region. Asymmetric calcified disc protrusion on the left at C5-6. Facet hypertrophy at several levels. No frank spinal stenosis.   Electronically Signed   By: Lowella Grip M.D.   On: 02/23/2014 15:03   Ct Thoracic Spine Wo Contrast  02/23/2014   CLINICAL DATA:  Fall 20 feet while  cleaning on gutters. Right proximal humeral fracture. Back pain  EXAM: CT THORACIC AND LUMBAR SPINE WITHOUT CONTRAST  TECHNIQUE: Multidetector CT imaging of the thoracic and lumbar spine was performed without contrast. Multiplanar CT image reconstructions were also generated.  COMPARISON:  Chest radiograph of 04/26/2013  FINDINGS: CT THORACIC SPINE FINDINGS  Small hiatal hernia noted.  No thoracic spine fracture or malalignment identified. Mild spurring anterior to the vertebral column at the T3-4 and T4-5 levels. No significant bony foraminal impingement.  CT LUMBAR SPINE FINDINGS  Acute 50% compression fracture of L1 observed with 7 mm posterior bony retropulsion the, resulting in mild central stenosis. No discrete foraminal impingement. No extension of the fracture into the posterior elements is observed. Paraspinal edema noted.  Transitional S1. Facet arthropathy and grade 1 anterolisthesis at L5-S1 along with an underlying disc bulge contribute to bilateral mild foraminal stenosis.  2 mm right mid kidney nonobstructive renal calculus.  IMPRESSION: 1. 50% compression fracture of L1 vertebral body with 7 mm of posterior bony retropulsion causing mild central stenosis. No posterior element involvement. Paraspinal edema noted. 2. Facet arthropathy in anterolisthesis at L5-S1 causing bilateral mild foraminal stenosis. 3. 2 mm right mid kidney nonobstructive renal calculus. 4. Small hiatal hernia. 5. No thoracic spine fracture identified. Minimal spurring anterior to the vertebral column at T3-4 and T4-5.   Electronically Signed   By: Sherryl Barters M.D.   On: 02/23/2014 19:12   Ct Lumbar Spine Wo Contrast  02/23/2014   CLINICAL DATA:  Fall 20 feet while cleaning on gutters. Right proximal humeral fracture. Back pain  EXAM: CT THORACIC AND LUMBAR SPINE WITHOUT CONTRAST  TECHNIQUE: Multidetector CT imaging of the thoracic and lumbar spine was performed without contrast. Multiplanar CT image reconstructions were  also generated.  COMPARISON:  Chest radiograph of 04/26/2013  FINDINGS: CT THORACIC SPINE FINDINGS  Small hiatal hernia noted.  No thoracic spine fracture or malalignment identified. Mild spurring anterior to the vertebral column at the T3-4 and T4-5 levels. No significant bony foraminal impingement.  CT LUMBAR SPINE FINDINGS  Acute 50% compression fracture of L1 observed with 7 mm posterior bony retropulsion the, resulting in mild central stenosis. No discrete foraminal impingement. No extension of the fracture into the posterior elements is observed. Paraspinal edema noted.  Transitional S1. Facet arthropathy and grade 1 anterolisthesis at L5-S1 along with an underlying disc bulge contribute to bilateral mild foraminal stenosis.  2 mm right mid kidney nonobstructive renal calculus.  IMPRESSION: 1. 50% compression fracture of L1 vertebral body with 7 mm of posterior bony retropulsion causing mild central stenosis. No posterior element involvement. Paraspinal edema noted. 2. Facet arthropathy in anterolisthesis at L5-S1 causing bilateral mild foraminal stenosis. 3. 2 mm right mid kidney nonobstructive renal calculus. 4. Small hiatal hernia. 5. No thoracic spine fracture identified. Minimal spurring anterior to  the vertebral column at T3-4 and T4-5.   Electronically Signed   By: Sherryl Barters M.D.   On: 02/23/2014 19:12   Ct Abdomen Pelvis W Contrast  02/23/2014   CLINICAL DATA:  53 year old male with history of trauma from a fall approximately 20 feet while cleaning out gutters. Fell onto right shoulder with obvious right shoulder deformity. Lower back stiffness.  EXAM: CT ABDOMEN AND PELVIS WITH CONTRAST  TECHNIQUE: Multidetector CT imaging of the abdomen and pelvis was performed using the standard protocol following bolus administration of intravenous contrast.  CONTRAST:  116m OMNIPAQUE IOHEXOL 300 MG/ML  SOLN  COMPARISON:  CT of the abdomen and pelvis 04/27/2011.  FINDINGS: Lower chest:  Small hiatal  hernia.  Otherwise, unremarkable.  Hepatobiliary: 6 mm low attenuation lesion in segment 8 of the liver is too small to characterize, but unchanged compared to prior study 04/27/2011, presumably a tiny cyst. No other suspicious appearing cystic or solid hepatic lesion. No signs of acute trauma to the liver. No intra or extrahepatic biliary ductal dilatation. Gallbladder is normal in appearance.  Pancreas: Unremarkable.  Spleen: No signs of acute trauma to the spleen.  Unremarkable.  Adrenals/Urinary Tract: Bilateral adrenal glands and bilateral kidneys are normal in appearance. Specifically, no signs of acute trauma to either kidney. No hydroureteronephrosis. Urinary bladder is normal in appearance.  Stomach/Bowel: Normal appearance of the stomach. No pathologic dilatation of small bowel or colon. A few scattered colonic diverticulae are noted, without surrounding inflammatory changes to suggest an acute diverticulitis at this time. Normal appendix. No definite signs of acute trauma to the bowel or mesentery.  Vascular/Lymphatic: No evidence of an acute traumatic injury to the major abdominal or pelvic arteries. No significant atherosclerotic disease or aneurysm. No lymphadenopathy identified in the abdomen or pelvis.  Reproductive: Prostate gland and seminal vesicles are unremarkable in appearance.  Other: No high attenuation fluid collection within in the peritoneal cavity or retroperitoneum to suggest significant posttraumatic hemorrhage. No significant volume of ascites. No pneumoperitoneum.  Musculoskeletal: There is an acute burst fracture of L1 with 4 mm of retropulsion of posterior fracture fragments which slightly narrows the central spinal canal, and approximately 60% loss of anterior vertebral body height. There are also nondisplaced fractures through the right superior and inferior pubic rami, the superior pubic ramus fracture is extremely subtle (noted only on coronal reformats). There are no aggressive  appearing lytic or blastic lesions noted in the visualized portions of the skeleton.  IMPRESSION: 1. Acute burst fracture of L1 with approximately 60% loss of anterior vertebral body height and 4 mm of retropulsion of fracture fragments which slightly narrows the central spinal canal. 2. Nondisplaced fractures of the right superior and inferior pubic rami. 3. No other findings of significant acute traumatic injury to the abdomen or pelvis. 4. Colonic diverticulosis without findings to suggest acute diverticulitis at this time. These results were called by telephone at the time of interpretation on 02/23/2014 at 7:23 pm to Dr. CMelchor Amour who verbally acknowledged these results.   Electronically Signed   By: DVinnie LangtonM.D.   On: 02/23/2014 19:25   Ct Shoulder Right Wo Contrast  02/23/2014   CLINICAL DATA:  Right shoulder deformity after falling 20 feet while cleaning gutters.  EXAM: CT OF THE RIGHT SHOULDER WITHOUT CONTRAST  TECHNIQUE: Multidetector CT imaging was performed according to the standard protocol. Multiplanar CT image reconstructions were also generated.  COMPARISON:  Radiographs of same day.  FINDINGS: Mildly comminuted oblique fracture is seen involving  the proximal left humeral neck with moderate anterior displacement of the humeral shaft relative to the humeral head. This appears to be closed and posttraumatic. Visualized ribs and scapula appear normal. The clavicle appears normal. Glenohumeral and acromioclavicular joints appear normal. Visualized portions of right lung appear normal.  IMPRESSION: Mildly comminuted and moderately displaced oblique fracture of the proximal left humeral neck is noted. This is the initial encounter.   Electronically Signed   By: Sabino Dick M.D.   On: 02/23/2014 18:44   Ct Elbow Right W/o Cm  02/23/2014   CLINICAL DATA:  Acute right elbow pain after falling 20 feet cleaning gutters.  EXAM: CT OF THE RIGHT ELBOW WITHOUT CONTRAST  TECHNIQUE: Multidetector CT  imaging was performed according to the standard protocol. Multiplanar CT image reconstructions were also generated.  COMPARISON:  None.  FINDINGS: The visualized portion of the distal humerus appears normal. Probable minimally displaced fracture of coronoid process of proximal ulna is noted. The olecranon appears normal. However, moderately comminuted fracture is seen involving the radial head with moderate posterior displacement of the largest fracture fragment. This fragment also appears to be rotated.  IMPRESSION: Comminuted fracture of the proximal right radial head is noted with moderate displacement and rotation of largest fracture fragment. Probable minimally displaced fracture involving coronoid process of proximal ulna.   Electronically Signed   By: Sabino Dick M.D.   On: 02/23/2014 19:11   Dg Humerus Right  02/23/2014   CLINICAL DATA:  Fall from ladder 20 feet, initial encounter  EXAM: RIGHT HUMERUS - 2+ VIEW  COMPARISON:  None.  FINDINGS: There is again noted a fracture through the surgical neck of the proximal humerus. Additionally there is fracture identified along the distal aspect of the humerus which appears to arise from the radial head. The fractures rotated out of the joint laterally.  IMPRESSION: Proximal right humeral fracture as well as the right radial head fracture with rotation of the fracture fragment out of the joint.   Electronically Signed   By: Inez Catalina M.D.   On: 02/23/2014 15:48      ASSESSMENT AND PLAN: Fall  L1 burst fracture.   R humerus fracture R radius and ulnar fracture Right superior and inferior pubic ramus fracture  Dr. Hal Neer saw patient from neurosurg.  He recommends TLSO brace.  This should be available in AM. Dr. Tamera Punt of ortho saw patient and recommends splinting of humerus/elbow with surgery in the next few weeks.   I will put him on clears until we know surgical timing.   He will likely be WBAT for his pelvic fracture once spine stabilized.   Will ask ortho to comment.   Admit to ICU for neuro checks.     Milus Height MD Surgical Oncology, General and [facility]      Visit Diagnoses: 1. Fall     Primary Care Physician: Joycelyn Man, MD

## 2014-02-23 NOTE — ED Provider Notes (Addendum)
Patient signed out to me by Dr. Colin Rhein to follow-up on imaging. Patient had been seen after a fall from a second story roof while cleaning gutters. He had initially complained of right arm pain and was found to have a fracture of the right radial head and right humeral neck. Patient also complained of incontinence of bowel after the fall. He did have some mild pain in the lower back. Patient had normal rectal tone, normal strength, normal sensation in lower extremities. Plain x-ray, however, did show evidence of a compression fracture. Patient sent back to radiology for CT imaging of the thoracic and lumbar spine. This did confirm a burst type fracture of the L1 vertebrae.  This was discussed with Dr. Hal Neer. He has seen and evaluated the patient. He has not recommended any surgical intervention.   Based on the patient's multiple injuries and significant pain, he will require hospitalization. Case discussed with Dr. Barry Dienes, trauma surgery, to see the patient for admission.  Results for orders placed or performed during the hospital encounter of 02/23/14  CBC with Differential  Result Value Ref Range   WBC 16.4 (H) 4.0 - 10.5 K/uL   RBC 4.46 4.22 - 5.81 MIL/uL   Hemoglobin 13.7 13.0 - 17.0 g/dL   HCT 39.7 39.0 - 52.0 %   MCV 89.0 78.0 - 100.0 fL   MCH 30.7 26.0 - 34.0 pg   MCHC 34.5 30.0 - 36.0 g/dL   RDW 13.1 11.5 - 15.5 %   Platelets 207 150 - 400 K/uL   Neutrophils Relative % 82 (H) 43 - 77 %   Neutro Abs 13.3 (H) 1.7 - 7.7 K/uL   Lymphocytes Relative 11 (L) 12 - 46 %   Lymphs Abs 1.9 0.7 - 4.0 K/uL   Monocytes Relative 7 3 - 12 %   Monocytes Absolute 1.2 (H) 0.1 - 1.0 K/uL   Eosinophils Relative 0 0 - 5 %   Eosinophils Absolute 0.0 0.0 - 0.7 K/uL   Basophils Relative 0 0 - 1 %   Basophils Absolute 0.0 0.0 - 0.1 K/uL  Comprehensive metabolic panel  Result Value Ref Range   Sodium 139 137 - 147 mEq/L   Potassium 3.6 (L) 3.7 - 5.3 mEq/L   Chloride 101 96 - 112 mEq/L   CO2 22 19 - 32  mEq/L   Glucose, Bld 136 (H) 70 - 99 mg/dL   BUN 16 6 - 23 mg/dL   Creatinine, Ser 1.17 0.50 - 1.35 mg/dL   Calcium 9.2 8.4 - 10.5 mg/dL   Total Protein 6.7 6.0 - 8.3 g/dL   Albumin 4.0 3.5 - 5.2 g/dL   AST 32 0 - 37 U/L   ALT 24 0 - 53 U/L   Alkaline Phosphatase 55 39 - 117 U/L   Total Bilirubin 1.5 (H) 0.3 - 1.2 mg/dL   GFR calc non Af Amer 71 (L) >90 mL/min   GFR calc Af Amer 82 (L) >90 mL/min   Anion gap 16 (H) 5 - 15   Dg Chest 2 View  02/23/2014   CLINICAL DATA:  Fall from ladder 20 feet landing on right side with pain, initial encounter  EXAM: CHEST  2 VIEW  COMPARISON:  None.  FINDINGS: The heart size and mediastinal contours are within normal limits. Both lungs are clear. The visualized skeletal structures are unremarkable.  IMPRESSION: No active cardiopulmonary disease.   Electronically Signed   By: Inez Catalina M.D.   On: 02/23/2014 15:41   Dg  Thoracic Spine W/swimmers  02/23/2014   CLINICAL DATA:  Fall 20 feet from ladder, initial encounter  EXAM: THORACIC SPINE - 2 VIEW + SWIMMERS  COMPARISON:  None.  FINDINGS: Compression deformity of L1 is again noted. No other compression deformities are seen. No gross soft tissue abnormality is noted.  IMPRESSION: L1 compression deformity. No other compression deformities are noted.   Electronically Signed   By: Inez Catalina M.D.   On: 02/23/2014 15:51   Dg Lumbar Spine 2-3 Views  02/23/2014   CLINICAL DATA:  Fall from ladder 2015 with back pain, initial encounter  EXAM: LUMBAR SPINE - 2-3 VIEW  COMPARISON:  None.  FINDINGS: Compression deformity of L1 is noted with approximately 50% vertebral body height loss anteriorly. No significant retropulsion is identified on these images. No other compression fracture is seen.  IMPRESSION: L1 compression deformity   Electronically Signed   By: Inez Catalina M.D.   On: 02/23/2014 15:50   Dg Pelvis 1-2 Views  02/23/2014   CLINICAL DATA:  Fall from ladder 2015, initial encounter  EXAM: PELVIS - 1-2  VIEW  COMPARISON:  None.  FINDINGS: There is no evidence of pelvic fracture or diastasis. No pelvic bone lesions are seen.  IMPRESSION: No acute abnormality noted.   Electronically Signed   By: Inez Catalina M.D.   On: 02/23/2014 15:50   Dg Shoulder Right  02/23/2014   CLINICAL DATA:  From ladder 20 feet with right arm pain, initial encounter  EXAM: RIGHT SHOULDER - 2+ VIEW  COMPARISON:  None.  FINDINGS: There is a fracture through the surgical neck of the humeral head with mild impaction identified. The scapula appears within normal limits. The underlying ribs are within normal limits.  IMPRESSION: Proximal right humeral fracture   Electronically Signed   By: Inez Catalina M.D.   On: 02/23/2014 15:46   Dg Elbow 2 Views Right  02/23/2014   CLINICAL DATA:  Fall from ladder 20 feet with elbow pain, initial encounter  EXAM: RIGHT ELBOW - 2 VIEW  COMPARISON:  None.  FINDINGS: There is a fracture through the radial head with the impaction and rotation of 1 of the fracture fragments out of the joint laterally. Joint effusion is noted.  IMPRESSION: Proximal radial head fracture with associated joint effusion.   Electronically Signed   By: Inez Catalina M.D.   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  Pain  EXAM: CT HEAD WITHOUT CONTRAST  CT CERVICAL SPINE WITHOUT CONTRAST  TECHNIQUE: Multidetector CT imaging of the head and cervical spine was performed following the standard protocol without intravenous contrast. Multiplanar CT image reconstructions of the cervical spine were also generated.  COMPARISON:  None.  FINDINGS: CT HEAD FINDINGS  The ventricles are normal in size and configuration. There is no mass, hemorrhage, extra-axial fluid collection, or midline shift. Gray-white compartments are normal. No acute infarct apparent.  On axial slice 15 series 3, there is a linear lucency in the medial occipital bone which does not traverse  the entire DI the occipital bone. It is questioned whether this lucency represents a prominent vascular groove versus an incomplete nondisplaced fracture. There is no displaced fracture. Mastoid air cells are clear.  CT CERVICAL SPINE FINDINGS  There is no cervical spine region fracture or spondylolisthesis. Prevertebral soft tissues and predental space regions are normal. There is mild disc space narrowing at C5-6. There is  a small anterior osteophyte along the inferior aspect of the C5 vertebral body. There is facet hypertrophy at several levels bilaterally. There is a calcified left paracentral disc protrusion at C5-6 on the left which abuts the cord but does not cause significant impression on the cord. There is no other significant disc protrusion. No spinal stenosis.  IMPRESSION: CT head: Question vascular prominence versus incomplete fracture medial right occipital bone mid inferior portion. If patient is tender focally in this area, would advise considering this lucency to represent a nondisplaced incomplete fracture. No displaced fracture. No intracranial mass, hemorrhage, or extra-axial fluid. The gray-white compartments appear normal.  CT cervical spine: No fracture or spondylolisthesis in the cervical region. Asymmetric calcified disc protrusion on the left at C5-6. Facet hypertrophy at several levels. No frank spinal stenosis.   Electronically Signed   By: Lowella Grip M.D.   On: 02/23/2014 15:03   Ct Cervical Spine Wo Contrast  02/23/2014   CLINICAL DATA:  Patient fell 20 feet while cleaning gutters.  Pain  EXAM: CT HEAD WITHOUT CONTRAST  CT CERVICAL SPINE WITHOUT CONTRAST  TECHNIQUE: Multidetector CT imaging of the head and cervical spine was performed following the standard protocol without intravenous contrast. Multiplanar CT image reconstructions of the cervical spine were also generated.  COMPARISON:  None.  FINDINGS: CT HEAD FINDINGS  The ventricles are normal in size and configuration.  There is no mass, hemorrhage, extra-axial fluid collection, or midline shift. Gray-white compartments are normal. No acute infarct apparent.  On axial slice 15 series 3, there is a linear lucency in the medial occipital bone which does not traverse the entire DI the occipital bone. It is questioned whether this lucency represents a prominent vascular groove versus an incomplete nondisplaced fracture. There is no displaced fracture. Mastoid air cells are clear.  CT CERVICAL SPINE FINDINGS  There is no cervical spine region fracture or spondylolisthesis. Prevertebral soft tissues and predental space regions are normal. There is mild disc space narrowing at C5-6. There is a small anterior osteophyte along the inferior aspect of the C5 vertebral body. There is facet hypertrophy at several levels bilaterally. There is a calcified left paracentral disc protrusion at C5-6 on the left which abuts the cord but does not cause significant impression on the cord. There is no other significant disc protrusion. No spinal stenosis.  IMPRESSION: CT head: Question vascular prominence versus incomplete fracture medial right occipital bone mid inferior portion. If patient is tender focally in this area, would advise considering this lucency to represent a nondisplaced incomplete fracture. No displaced fracture. No intracranial mass, hemorrhage, or extra-axial fluid. The gray-white compartments appear normal.  CT cervical spine: No fracture or spondylolisthesis in the cervical region. Asymmetric calcified disc protrusion on the left at C5-6. Facet hypertrophy at several levels. No frank spinal stenosis.   Electronically Signed   By: Lowella Grip M.D.   On: 02/23/2014 15:03   Ct Thoracic Spine Wo Contrast  02/23/2014   CLINICAL DATA:  Fall 20 feet while cleaning on gutters. Right proximal humeral fracture. Back pain  EXAM: CT THORACIC AND LUMBAR SPINE WITHOUT CONTRAST  TECHNIQUE: Multidetector CT imaging of the thoracic and  lumbar spine was performed without contrast. Multiplanar CT image reconstructions were also generated.  COMPARISON:  Chest radiograph of 04/26/2013  FINDINGS: CT THORACIC SPINE FINDINGS  Small hiatal hernia noted.  No thoracic spine fracture or malalignment identified. Mild spurring anterior to the vertebral column at the T3-4 and T4-5 levels. No significant bony  foraminal impingement.  CT LUMBAR SPINE FINDINGS  Acute 50% compression fracture of L1 observed with 7 mm posterior bony retropulsion the, resulting in mild central stenosis. No discrete foraminal impingement. No extension of the fracture into the posterior elements is observed. Paraspinal edema noted.  Transitional S1. Facet arthropathy and grade 1 anterolisthesis at L5-S1 along with an underlying disc bulge contribute to bilateral mild foraminal stenosis.  2 mm right mid kidney nonobstructive renal calculus.  IMPRESSION: 1. 50% compression fracture of L1 vertebral body with 7 mm of posterior bony retropulsion causing mild central stenosis. No posterior element involvement. Paraspinal edema noted. 2. Facet arthropathy in anterolisthesis at L5-S1 causing bilateral mild foraminal stenosis. 3. 2 mm right mid kidney nonobstructive renal calculus. 4. Small hiatal hernia. 5. No thoracic spine fracture identified. Minimal spurring anterior to the vertebral column at T3-4 and T4-5.   Electronically Signed   By: Sherryl Barters M.D.   On: 02/23/2014 19:12   Ct Lumbar Spine Wo Contrast  02/23/2014   CLINICAL DATA:  Fall 20 feet while cleaning on gutters. Right proximal humeral fracture. Back pain  EXAM: CT THORACIC AND LUMBAR SPINE WITHOUT CONTRAST  TECHNIQUE: Multidetector CT imaging of the thoracic and lumbar spine was performed without contrast. Multiplanar CT image reconstructions were also generated.  COMPARISON:  Chest radiograph of 04/26/2013  FINDINGS: CT THORACIC SPINE FINDINGS  Small hiatal hernia noted.  No thoracic spine fracture or malalignment  identified. Mild spurring anterior to the vertebral column at the T3-4 and T4-5 levels. No significant bony foraminal impingement.  CT LUMBAR SPINE FINDINGS  Acute 50% compression fracture of L1 observed with 7 mm posterior bony retropulsion the, resulting in mild central stenosis. No discrete foraminal impingement. No extension of the fracture into the posterior elements is observed. Paraspinal edema noted.  Transitional S1. Facet arthropathy and grade 1 anterolisthesis at L5-S1 along with an underlying disc bulge contribute to bilateral mild foraminal stenosis.  2 mm right mid kidney nonobstructive renal calculus.  IMPRESSION: 1. 50% compression fracture of L1 vertebral body with 7 mm of posterior bony retropulsion causing mild central stenosis. No posterior element involvement. Paraspinal edema noted. 2. Facet arthropathy in anterolisthesis at L5-S1 causing bilateral mild foraminal stenosis. 3. 2 mm right mid kidney nonobstructive renal calculus. 4. Small hiatal hernia. 5. No thoracic spine fracture identified. Minimal spurring anterior to the vertebral column at T3-4 and T4-5.   Electronically Signed   By: Sherryl Barters M.D.   On: 02/23/2014 19:12   Ct Abdomen Pelvis W Contrast  02/23/2014   CLINICAL DATA:  53 year old male with history of trauma from a fall approximately 20 feet while cleaning out gutters. Fell onto right shoulder with obvious right shoulder deformity. Lower back stiffness.  EXAM: CT ABDOMEN AND PELVIS WITH CONTRAST  TECHNIQUE: Multidetector CT imaging of the abdomen and pelvis was performed using the standard protocol following bolus administration of intravenous contrast.  CONTRAST:  166mL OMNIPAQUE IOHEXOL 300 MG/ML  SOLN  COMPARISON:  CT of the abdomen and pelvis 04/27/2011.  FINDINGS: Lower chest:  Small hiatal hernia.  Otherwise, unremarkable.  Hepatobiliary: 6 mm low attenuation lesion in segment 8 of the liver is too small to characterize, but unchanged compared to prior study  04/27/2011, presumably a tiny cyst. No other suspicious appearing cystic or solid hepatic lesion. No signs of acute trauma to the liver. No intra or extrahepatic biliary ductal dilatation. Gallbladder is normal in appearance.  Pancreas: Unremarkable.  Spleen: No signs of acute trauma to  the spleen.  Unremarkable.  Adrenals/Urinary Tract: Bilateral adrenal glands and bilateral kidneys are normal in appearance. Specifically, no signs of acute trauma to either kidney. No hydroureteronephrosis. Urinary bladder is normal in appearance.  Stomach/Bowel: Normal appearance of the stomach. No pathologic dilatation of small bowel or colon. A few scattered colonic diverticulae are noted, without surrounding inflammatory changes to suggest an acute diverticulitis at this time. Normal appendix. No definite signs of acute trauma to the bowel or mesentery.  Vascular/Lymphatic: No evidence of an acute traumatic injury to the major abdominal or pelvic arteries. No significant atherosclerotic disease or aneurysm. No lymphadenopathy identified in the abdomen or pelvis.  Reproductive: Prostate gland and seminal vesicles are unremarkable in appearance.  Other: No high attenuation fluid collection within in the peritoneal cavity or retroperitoneum to suggest significant posttraumatic hemorrhage. No significant volume of ascites. No pneumoperitoneum.  Musculoskeletal: There is an acute burst fracture of L1 with 4 mm of retropulsion of posterior fracture fragments which slightly narrows the central spinal canal, and approximately 60% loss of anterior vertebral body height. There are also nondisplaced fractures through the right superior and inferior pubic rami, the superior pubic ramus fracture is extremely subtle (noted only on coronal reformats). There are no aggressive appearing lytic or blastic lesions noted in the visualized portions of the skeleton.  IMPRESSION: 1. Acute burst fracture of L1 with approximately 60% loss of anterior  vertebral body height and 4 mm of retropulsion of fracture fragments which slightly narrows the central spinal canal. 2. Nondisplaced fractures of the right superior and inferior pubic rami. 3. No other findings of significant acute traumatic injury to the abdomen or pelvis. 4. Colonic diverticulosis without findings to suggest acute diverticulitis at this time. These results were called by telephone at the time of interpretation on 02/23/2014 at 7:23 pm to Dr. Melchor Amour, who verbally acknowledged these results.   Electronically Signed   By: Vinnie Langton M.D.   On: 02/23/2014 19:25   Ct Shoulder Right Wo Contrast  02/23/2014   CLINICAL DATA:  Right shoulder deformity after falling 20 feet while cleaning gutters.  EXAM: CT OF THE RIGHT SHOULDER WITHOUT CONTRAST  TECHNIQUE: Multidetector CT imaging was performed according to the standard protocol. Multiplanar CT image reconstructions were also generated.  COMPARISON:  Radiographs of same day.  FINDINGS: Mildly comminuted oblique fracture is seen involving the proximal left humeral neck with moderate anterior displacement of the humeral shaft relative to the humeral head. This appears to be closed and posttraumatic. Visualized ribs and scapula appear normal. The clavicle appears normal. Glenohumeral and acromioclavicular joints appear normal. Visualized portions of right lung appear normal.  IMPRESSION: Mildly comminuted and moderately displaced oblique fracture of the proximal left humeral neck is noted. This is the initial encounter.   Electronically Signed   By: Sabino Dick M.D.   On: 02/23/2014 18:44   Ct Elbow Right W/o Cm  02/23/2014   CLINICAL DATA:  Acute right elbow pain after falling 20 feet cleaning gutters.  EXAM: CT OF THE RIGHT ELBOW WITHOUT CONTRAST  TECHNIQUE: Multidetector CT imaging was performed according to the standard protocol. Multiplanar CT image reconstructions were also generated.  COMPARISON:  None.  FINDINGS: The visualized portion  of the distal humerus appears normal. Probable minimally displaced fracture of coronoid process of proximal ulna is noted. The olecranon appears normal. However, moderately comminuted fracture is seen involving the radial head with moderate posterior displacement of the largest fracture fragment. This fragment also appears to be rotated.  IMPRESSION: Comminuted fracture of the proximal right radial head is noted with moderate displacement and rotation of largest fracture fragment. Probable minimally displaced fracture involving coronoid process of proximal ulna.   Electronically Signed   By: Sabino Dick M.D.   On: 02/23/2014 19:11   Dg Humerus Right  02/23/2014   CLINICAL DATA:  Fall from ladder 20 feet, initial encounter  EXAM: RIGHT HUMERUS - 2+ VIEW  COMPARISON:  None.  FINDINGS: There is again noted a fracture through the surgical neck of the proximal humerus. Additionally there is fracture identified along the distal aspect of the humerus which appears to arise from the radial head. The fractures rotated out of the joint laterally.  IMPRESSION: Proximal right humeral fracture as well as the right radial head fracture with rotation of the fracture fragment out of the joint.   Electronically Signed   By: Inez Catalina M.D.   On: 02/23/2014 15:48   Focal neurologic EXAM: Patient has normal sensation in both lower extremities. There is normal strength in both lower extremities.    Orpah Greek, MD 02/23/14 [id number]  Orpah Greek, MD 02/23/14 [id number]

## 2014-02-23 NOTE — Progress Notes (Signed)
Orthopedic Tech Progress Note Patient Details:  Vincent [name] [birth year] [id number] Applied fiberglass posterior long arm splint to RUE.  Pulses, sensation, motion intact before and after application.  Capillary refill less than 2 seconds before and after application.  Placed RUE in arm sling after splinting. Ortho Devices Type of Ortho Device: Post (long leg) splint, Arm sling Ortho Device/Splint Location: RUE Ortho Device/Splint Interventions: Application   Darrol Poke 02/23/2014, 10:31 PM

## 2014-02-23 NOTE — ED Notes (Signed)
Dr. Betsey Holiday updated pt.  Still awaiting to hear from neurosurgeon.  Trauma to admit.

## 2014-02-23 NOTE — Consult Note (Signed)
Reason for Consult:evaluate right upper extremity injury Referring Physician: Dr. Canary Brim Westhoff is an 52 y.o. male.  HPI: The patient is a very pleasant 53 year old male who fell 15 feet from a ladder today onto his right side.  He had immediate complaint of pain and deformity of the right upper extremity.  He was brought to the hospital where he was found to have proximal humerus and radial head fractures and also a L1 fracture.  He complains of severe pain in the right upper extremity proximally and at the elbow which is worse with movement, better with rest.  He has no associated numbness or tingling.  He also has low back pain.  No lower extremity numbness or weakness.  Denies injury to the left arm or lower extremities.  Past Medical History  Diagnosis Date  . Skin cancer     right shoulder    Past Surgical History  Procedure Laterality Date  . Skin cancer excision      right shoulder, removed by Dr Tonia Brooms  . Cyst removal hand      left hand/benign    Family History  Problem Relation Age of Onset  . Stroke Mother   . Diverticulitis Brother   . Hernia Brother   . Colon cancer Neg Hx   . Rectal cancer Neg Hx   . Stomach cancer Neg Hx     Social History:  reports that he has never smoked. He has never used smokeless tobacco. He reports that he drinks about 1.8 oz of alcohol per week. He reports that he does not use illicit drugs.  Allergies: No Known Allergies  Medications: Prior to Admission:  (Not in a hospital admission)  Results for orders placed or performed during the hospital encounter of 02/23/14 (from the past 48 hour(s))  CBC with Differential     Status: Abnormal   Collection Time: 02/23/14  1:37 PM  Result Value Ref Range   WBC 16.4 (H) 4.0 - 10.5 K/uL   RBC 4.46 4.22 - 5.81 MIL/uL   Hemoglobin 13.7 13.0 - 17.0 g/dL   HCT 39.7 39.0 - 52.0 %   MCV 89.0 78.0 - 100.0 fL   MCH 30.7 26.0 - 34.0 pg   MCHC 34.5 30.0 - 36.0 g/dL   RDW 13.1 11.5 - 15.5 %    Platelets 207 150 - 400 K/uL   Neutrophils Relative % 82 (H) 43 - 77 %   Neutro Abs 13.3 (H) 1.7 - 7.7 K/uL   Lymphocytes Relative 11 (L) 12 - 46 %   Lymphs Abs 1.9 0.7 - 4.0 K/uL   Monocytes Relative 7 3 - 12 %   Monocytes Absolute 1.2 (H) 0.1 - 1.0 K/uL   Eosinophils Relative 0 0 - 5 %   Eosinophils Absolute 0.0 0.0 - 0.7 K/uL   Basophils Relative 0 0 - 1 %   Basophils Absolute 0.0 0.0 - 0.1 K/uL  Comprehensive metabolic panel     Status: Abnormal   Collection Time: 02/23/14  1:37 PM  Result Value Ref Range   Sodium 139 137 - 147 mEq/L   Potassium 3.6 (L) 3.7 - 5.3 mEq/L   Chloride 101 96 - 112 mEq/L   CO2 22 19 - 32 mEq/L   Glucose, Bld 136 (H) 70 - 99 mg/dL   BUN 16 6 - 23 mg/dL   Creatinine, Ser 1.17 0.50 - 1.35 mg/dL   Calcium 9.2 8.4 - 10.5 mg/dL   Total Protein 6.7 6.0 -  8.3 g/dL   Albumin 4.0 3.5 - 5.2 g/dL   AST 32 0 - 37 U/L   ALT 24 0 - 53 U/L   Alkaline Phosphatase 55 39 - 117 U/L   Total Bilirubin 1.5 (H) 0.3 - 1.2 mg/dL   GFR calc non Af Amer 71 (L) >90 mL/min   GFR calc Af Amer 82 (L) >90 mL/min    Comment: (NOTE) The eGFR has been calculated using the CKD EPI equation. This calculation has not been validated in all clinical situations. eGFR's persistently <90 mL/min signify possible Chronic Kidney Disease.    Anion gap 16 (H) 5 - 15    Dg Chest 2 View  02/23/2014   CLINICAL DATA:  Fall from ladder 20 feet landing on right side with pain, initial encounter  EXAM: CHEST  2 VIEW  COMPARISON:  None.  FINDINGS: The heart size and mediastinal contours are within normal limits. Both lungs are clear. The visualized skeletal structures are unremarkable.  IMPRESSION: No active cardiopulmonary disease.   Electronically Signed   By: Inez Catalina M.D.   On: 02/23/2014 15:41   Dg Thoracic Spine W/swimmers  02/23/2014   CLINICAL DATA:  Fall 20 feet from ladder, initial encounter  EXAM: THORACIC SPINE - 2 VIEW + SWIMMERS  COMPARISON:  None.  FINDINGS: Compression  deformity of L1 is again noted. No other compression deformities are seen. No gross soft tissue abnormality is noted.  IMPRESSION: L1 compression deformity. No other compression deformities are noted.   Electronically Signed   By: Inez Catalina M.D.   On: 02/23/2014 15:51   Dg Lumbar Spine 2-3 Views  02/23/2014   CLINICAL DATA:  Fall from ladder 2015 with back pain, initial encounter  EXAM: LUMBAR SPINE - 2-3 VIEW  COMPARISON:  None.  FINDINGS: Compression deformity of L1 is noted with approximately 50% vertebral body height loss anteriorly. No significant retropulsion is identified on these images. No other compression fracture is seen.  IMPRESSION: L1 compression deformity   Electronically Signed   By: Inez Catalina M.D.   On: 02/23/2014 15:50   Dg Pelvis 1-2 Views  02/23/2014   CLINICAL DATA:  Fall from ladder 2015, initial encounter  EXAM: PELVIS - 1-2 VIEW  COMPARISON:  None.  FINDINGS: There is no evidence of pelvic fracture or diastasis. No pelvic bone lesions are seen.  IMPRESSION: No acute abnormality noted.   Electronically Signed   By: Inez Catalina M.D.   On: 02/23/2014 15:50   Dg Shoulder Right  02/23/2014   CLINICAL DATA:  From ladder 20 feet with right arm pain, initial encounter  EXAM: RIGHT SHOULDER - 2+ VIEW  COMPARISON:  None.  FINDINGS: There is a fracture through the surgical neck of the humeral head with mild impaction identified. The scapula appears within normal limits. The underlying ribs are within normal limits.  IMPRESSION: Proximal right humeral fracture   Electronically Signed   By: Inez Catalina M.D.   On: 02/23/2014 15:46   Dg Elbow 2 Views Right  02/23/2014   CLINICAL DATA:  Fall from ladder 20 feet with elbow pain, initial encounter  EXAM: RIGHT ELBOW - 2 VIEW  COMPARISON:  None.  FINDINGS: There is a fracture through the radial head with the impaction and rotation of 1 of the fracture fragments out of the joint laterally. Joint effusion is noted.  IMPRESSION: Proximal  radial head fracture with associated joint effusion.   Electronically Signed   By: Linus Mako.D.  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  Pain  EXAM: CT HEAD WITHOUT CONTRAST  CT CERVICAL SPINE WITHOUT CONTRAST  TECHNIQUE: Multidetector CT imaging of the head and cervical spine was performed following the standard protocol without intravenous contrast. Multiplanar CT image reconstructions of the cervical spine were also generated.  COMPARISON:  None.  FINDINGS: CT HEAD FINDINGS  The ventricles are normal in size and configuration. There is no mass, hemorrhage, extra-axial fluid collection, or midline shift. Gray-white compartments are normal. No acute infarct apparent.  On axial slice 15 series 3, there is a linear lucency in the medial occipital bone which does not traverse the entire DI the occipital bone. It is questioned whether this lucency represents a prominent vascular groove versus an incomplete nondisplaced fracture. There is no displaced fracture. Mastoid air cells are clear.  CT CERVICAL SPINE FINDINGS  There is no cervical spine region fracture or spondylolisthesis. Prevertebral soft tissues and predental space regions are normal. There is mild disc space narrowing at C5-6. There is a small anterior osteophyte along the inferior aspect of the C5 vertebral body. There is facet hypertrophy at several levels bilaterally. There is a calcified left paracentral disc protrusion at C5-6 on the left which abuts the cord but does not cause significant impression on the cord. There is no other significant disc protrusion. No spinal stenosis.  IMPRESSION: CT head: Question vascular prominence versus incomplete fracture medial right occipital bone mid inferior portion. If patient is tender focally in this area, would advise considering this lucency to represent a nondisplaced incomplete fracture. No displaced fracture. No  intracranial mass, hemorrhage, or extra-axial fluid. The gray-white compartments appear normal.  CT cervical spine: No fracture or spondylolisthesis in the cervical region. Asymmetric calcified disc protrusion on the left at C5-6. Facet hypertrophy at several levels. No frank spinal stenosis.   Electronically Signed   By: Lowella Grip M.D.   On: 02/23/2014 15:03   Ct Cervical Spine Wo Contrast  02/23/2014   CLINICAL DATA:  Patient fell 20 feet while cleaning gutters.  Pain  EXAM: CT HEAD WITHOUT CONTRAST  CT CERVICAL SPINE WITHOUT CONTRAST  TECHNIQUE: Multidetector CT imaging of the head and cervical spine was performed following the standard protocol without intravenous contrast. Multiplanar CT image reconstructions of the cervical spine were also generated.  COMPARISON:  None.  FINDINGS: CT HEAD FINDINGS  The ventricles are normal in size and configuration. There is no mass, hemorrhage, extra-axial fluid collection, or midline shift. Gray-white compartments are normal. No acute infarct apparent.  On axial slice 15 series 3, there is a linear lucency in the medial occipital bone which does not traverse the entire DI the occipital bone. It is questioned whether this lucency represents a prominent vascular groove versus an incomplete nondisplaced fracture. There is no displaced fracture. Mastoid air cells are clear.  CT CERVICAL SPINE FINDINGS  There is no cervical spine region fracture or spondylolisthesis. Prevertebral soft tissues and predental space regions are normal. There is mild disc space narrowing at C5-6. There is a small anterior osteophyte along the inferior aspect of the C5 vertebral body. There is facet hypertrophy at several levels bilaterally. There is a calcified left paracentral disc protrusion at C5-6 on the left which abuts the cord but does not cause significant impression on the cord. There is no other significant disc protrusion. No spinal stenosis.  IMPRESSION: CT head: Question  vascular prominence versus incomplete fracture medial right occipital bone mid inferior portion. If patient is tender focally in this area, would advise considering this lucency to represent a nondisplaced incomplete fracture. No displaced fracture. No intracranial mass, hemorrhage, or extra-axial fluid. The gray-white compartments appear normal.  CT cervical spine: No fracture or spondylolisthesis in the cervical region. Asymmetric calcified disc protrusion on the left at C5-6. Facet hypertrophy at several levels. No frank spinal stenosis.   Electronically Signed   By: Lowella Grip M.D.   On: 02/23/2014 15:03   Ct Thoracic Spine Wo Contrast  02/23/2014   CLINICAL DATA:  Fall 20 feet while cleaning on gutters. Right proximal humeral fracture. Back pain  EXAM: CT THORACIC AND LUMBAR SPINE WITHOUT CONTRAST  TECHNIQUE: Multidetector CT imaging of the thoracic and lumbar spine was performed without contrast. Multiplanar CT image reconstructions were also generated.  COMPARISON:  Chest radiograph of 04/26/2013  FINDINGS: CT THORACIC SPINE FINDINGS  Small hiatal hernia noted.  No thoracic spine fracture or malalignment identified. Mild spurring anterior to the vertebral column at the T3-4 and T4-5 levels. No significant bony foraminal impingement.  CT LUMBAR SPINE FINDINGS  Acute 50% compression fracture of L1 observed with 7 mm posterior bony retropulsion the, resulting in mild central stenosis. No discrete foraminal impingement. No extension of the fracture into the posterior elements is observed. Paraspinal edema noted.  Transitional S1. Facet arthropathy and grade 1 anterolisthesis at L5-S1 along with an underlying disc bulge contribute to bilateral mild foraminal stenosis.  2 mm right mid kidney nonobstructive renal calculus.  IMPRESSION: 1. 50% compression fracture of L1 vertebral body with 7 mm of posterior bony retropulsion causing mild central stenosis. No posterior element involvement. Paraspinal edema  noted. 2. Facet arthropathy in anterolisthesis at L5-S1 causing bilateral mild foraminal stenosis. 3. 2 mm right mid kidney nonobstructive renal calculus. 4. Small hiatal hernia. 5. No thoracic spine fracture identified. Minimal spurring anterior to the vertebral column at T3-4 and T4-5.   Electronically Signed   By: Sherryl Barters M.D.   On: 02/23/2014 19:12   Ct Lumbar Spine Wo Contrast  02/23/2014   CLINICAL DATA:  Fall 20 feet while cleaning on gutters. Right proximal humeral fracture. Back pain  EXAM: CT THORACIC AND LUMBAR SPINE WITHOUT CONTRAST  TECHNIQUE: Multidetector CT imaging of the thoracic and lumbar spine was performed without contrast. Multiplanar CT image reconstructions were also generated.  COMPARISON:  Chest radiograph of 04/26/2013  FINDINGS: CT THORACIC SPINE FINDINGS  Small hiatal hernia noted.  No thoracic spine fracture or malalignment identified. Mild spurring anterior to the vertebral column at the T3-4 and T4-5 levels. No significant bony foraminal impingement.  CT LUMBAR SPINE FINDINGS  Acute 50% compression fracture of L1 observed with 7 mm posterior bony retropulsion the, resulting in mild central stenosis. No discrete foraminal impingement. No extension of the fracture into the posterior elements is observed. Paraspinal edema noted.  Transitional S1. Facet arthropathy and grade 1 anterolisthesis at L5-S1 along with an underlying disc bulge contribute to bilateral mild foraminal stenosis.  2 mm right mid kidney nonobstructive renal calculus.  IMPRESSION: 1. 50% compression fracture of L1 vertebral body with 7 mm of posterior bony retropulsion causing mild central stenosis. No posterior element involvement. Paraspinal edema noted. 2. Facet arthropathy in anterolisthesis at L5-S1 causing bilateral mild foraminal stenosis. 3. 2 mm right mid kidney nonobstructive renal calculus. 4. Small hiatal hernia. 5. No thoracic spine fracture identified. Minimal spurring anterior to the  vertebral column  at T3-4 and T4-5.   Electronically Signed   By: Sherryl Barters M.D.   On: 02/23/2014 19:12   Ct Shoulder Right Wo Contrast  02/23/2014   CLINICAL DATA:  Right shoulder deformity after falling 20 feet while cleaning gutters.  EXAM: CT OF THE RIGHT SHOULDER WITHOUT CONTRAST  TECHNIQUE: Multidetector CT imaging was performed according to the standard protocol. Multiplanar CT image reconstructions were also generated.  COMPARISON:  Radiographs of same day.  FINDINGS: Mildly comminuted oblique fracture is seen involving the proximal left humeral neck with moderate anterior displacement of the humeral shaft relative to the humeral head. This appears to be closed and posttraumatic. Visualized ribs and scapula appear normal. The clavicle appears normal. Glenohumeral and acromioclavicular joints appear normal. Visualized portions of right lung appear normal.  IMPRESSION: Mildly comminuted and moderately displaced oblique fracture of the proximal left humeral neck is noted. This is the initial encounter.   Electronically Signed   By: Sabino Dick M.D.   On: 02/23/2014 18:44   Ct Elbow Right W/o Cm  02/23/2014   CLINICAL DATA:  Acute right elbow pain after falling 20 feet cleaning gutters.  EXAM: CT OF THE RIGHT ELBOW WITHOUT CONTRAST  TECHNIQUE: Multidetector CT imaging was performed according to the standard protocol. Multiplanar CT image reconstructions were also generated.  COMPARISON:  None.  FINDINGS: The visualized portion of the distal humerus appears normal. Probable minimally displaced fracture of coronoid process of proximal ulna is noted. The olecranon appears normal. However, moderately comminuted fracture is seen involving the radial head with moderate posterior displacement of the largest fracture fragment. This fragment also appears to be rotated.  IMPRESSION: Comminuted fracture of the proximal right radial head is noted with moderate displacement and rotation of largest fracture  fragment. Probable minimally displaced fracture involving coronoid process of proximal ulna.   Electronically Signed   By: Sabino Dick M.D.   On: 02/23/2014 19:11   Dg Humerus Right  02/23/2014   CLINICAL DATA:  Fall from ladder 20 feet, initial encounter  EXAM: RIGHT HUMERUS - 2+ VIEW  COMPARISON:  None.  FINDINGS: There is again noted a fracture through the surgical neck of the proximal humerus. Additionally there is fracture identified along the distal aspect of the humerus which appears to arise from the radial head. The fractures rotated out of the joint laterally.  IMPRESSION: Proximal right humeral fracture as well as the right radial head fracture with rotation of the fracture fragment out of the joint.   Electronically Signed   By: Inez Catalina M.D.   On: 02/23/2014 15:48    Review of Systems  All other systems reviewed and are negative.  Blood pressure 122/68, pulse 72, temperature 98 F (36.7 C), temperature source Oral, resp. rate 16, height '6\' 1"'  (1.854 m), weight 81.647 kg (180 lb), SpO2 99 %. Physical Exam  Constitutional: He is oriented to person, place, and time. He appears well-developed and well-nourished.  HENT:  Head: Atraumatic.  Eyes: EOM are normal.  Cardiovascular: Intact distal pulses.   Respiratory: Effort normal.  Musculoskeletal:  The patient is lying flat on his back in bed with a cervical collar intact. Right shoulder and moderately swollen, tender to palpation.  Skin intact.  Right elbow with a small abrasion posterior lateral, otherwise skin intact.  Tender to palpation of the radial head with swelling and prominence.  Distally neurovascularly intact with intact sensation to light touch median ulnar and radial nerves with intact grip EPL and hand  intrinsics.  Left upper extremity and traumatic without pain or tenderness.bilaterallower extremities atraumatic without pain or tenderness and completely neurovascularly intact with intact distal strength and sensation.   Neurological: He is alert and oriented to person, place, and time.  Skin: Skin is warm and dry.  Psychiatric: He has a normal mood and affect.    Assessment/Plan: Displaced right proximal humerus fracture and comminuted right radial head fracture after a fall. These injuries will both necessitate surgical management, however neither is urgent.  He can be splinted in a posterior splint for his elbow and a sling for his proximal humerus fracture.  Surgery can be done any time in the next 2 weeks.  He needs further evaluation of his L1 fracture by neurosurgery.  For now he will remain on full spinal precautions.  Once it is determined whether treatment of his L1 fracture will be conservative or surgical I will make further plans about timing of his right upper extremity surgery.  [name] 02/23/2014, 7:21 PM

## 2014-02-23 NOTE — ED Provider Notes (Signed)
CSN: [id number]     Arrival date & time 02/23/14  1315 History   First MD Initiated Contact with Patient 02/23/14 1316     Chief Complaint  Patient presents with  . Fall  . Shoulder Pain     (Consider location/radiation/quality/duration/timing/severity/associated sxs/prior Treatment) Patient is a 53 y.o. male presenting with fall and shoulder pain.  Fall This is a new problem. The current episode started less than 1 hour ago. The problem occurs constantly. The problem has not changed since onset.Pertinent negatives include no chest pain and no abdominal pain. Nothing aggravates the symptoms. Nothing relieves the symptoms.  Shoulder Pain   Past Medical History  Diagnosis Date  . Skin cancer     right shoulder   Past Surgical History  Procedure Laterality Date  . Skin cancer excision      right shoulder, removed by Dr Tonia Brooms  . Cyst removal hand      left hand/benign   Family History  Problem Relation Age of Onset  . Stroke Mother   . Diverticulitis Brother   . Hernia Brother   . Colon cancer Neg Hx   . Rectal cancer Neg Hx   . Stomach cancer Neg Hx    History  Substance Use Topics  . Smoking status: Never Smoker   . Smokeless tobacco: Never Used  . Alcohol Use: 1.8 oz/week    3 Glasses of wine per week    Review of Systems  Cardiovascular: Negative for chest pain.  Gastrointestinal: Negative for abdominal pain.  All other systems reviewed and are negative.     Allergies  Review of patient's allergies indicates no known allergies.  Home Medications   Prior to Admission medications   Medication Sig Start Date End Date Taking? Authorizing Provider  citalopram (CELEXA) 20 MG tablet 1 tab each bedtime Patient taking differently: Take 20 mg by mouth at bedtime.  02/18/14  Yes Dorena Cookey, MD  Multiple Vitamins-Minerals (ONE-A-DAY 50 PLUS PO) Take by mouth daily.   Yes Historical Provider, MD   BP 123/80 mmHg  Pulse 86  Temp(Src) 98.8 F (37.1 C) (Oral)   Resp 14  Ht 6\' 1"  (1.854 m)  Wt 184 lb 4.9 oz (83.6 kg)  BMI 24.32 kg/m2  SpO2 95% Physical Exam  Constitutional: He is oriented to person, place, and time. He appears well-developed and well-nourished.  HENT:  Head: Normocephalic and atraumatic.  Eyes: Conjunctivae and EOM are normal.  Neck: Normal range of motion. Neck supple.  Cardiovascular: Normal rate, regular rhythm and normal heart sounds.   Pulmonary/Chest: Effort normal and breath sounds normal. No respiratory distress.  Abdominal: He exhibits no distension. There is no tenderness. There is no rebound and no guarding.  Genitourinary: Rectal exam shows anal tone normal.  Musculoskeletal:       Right shoulder: He exhibits decreased range of motion, tenderness, bony tenderness and swelling.       Right elbow: He exhibits laceration (small abrasion).       Cervical back: Normal.       Thoracic back: Normal.       Lumbar back: He exhibits tenderness and bony tenderness (very mild).  Neurological: He is alert and oriented to person, place, and time. He has normal strength and normal reflexes. No cranial nerve deficit or sensory deficit.  Skin: Skin is warm and dry.  Vitals reviewed.   ED Course  Procedures (including critical care time) Labs Review Labs Reviewed  CBC WITH DIFFERENTIAL - Abnormal; Notable  for the following:    WBC 16.4 (*)    Neutrophils Relative % 82 (*)    Neutro Abs 13.3 (*)    Lymphocytes Relative 11 (*)    Monocytes Absolute 1.2 (*)    All other components within normal limits  COMPREHENSIVE METABOLIC PANEL - Abnormal; Notable for the following:    Potassium 3.6 (*)    Glucose, Bld 136 (*)    Total Bilirubin 1.5 (*)    GFR calc non Af Amer 71 (*)    GFR calc Af Amer 82 (*)    Anion gap 16 (*)    All other components within normal limits  CBC - Abnormal; Notable for the following:    RBC 3.99 (*)    Hemoglobin 12.0 (*)    HCT 35.7 (*)    All other components within normal limits  BASIC  METABOLIC PANEL - Abnormal; Notable for the following:    Glucose, Bld 145 (*)    GFR calc non Af Amer 79 (*)    All other components within normal limits  GLUCOSE, CAPILLARY - Abnormal; Notable for the following:    Glucose-Capillary 160 (*)    All other components within normal limits  MRSA PCR SCREENING    Imaging Review Dg Chest 2 View  02/23/2014   CLINICAL DATA:  Fall from ladder 20 feet landing on right side with pain, initial encounter  EXAM: CHEST  2 VIEW  COMPARISON:  None.  FINDINGS: The heart size and mediastinal contours are within normal limits. Both lungs are clear. The visualized skeletal structures are unremarkable.  IMPRESSION: No active cardiopulmonary disease.   Electronically Signed   By: Inez Catalina M.D.   On: 02/23/2014 15:41   Dg Thoracic Spine W/swimmers  02/23/2014   CLINICAL DATA:  Fall 20 feet from ladder, initial encounter  EXAM: THORACIC SPINE - 2 VIEW + SWIMMERS  COMPARISON:  None.  FINDINGS: Compression deformity of L1 is again noted. No other compression deformities are seen. No gross soft tissue abnormality is noted.  IMPRESSION: L1 compression deformity. No other compression deformities are noted.   Electronically Signed   By: Inez Catalina M.D.   On: 02/23/2014 15:51   Dg Lumbar Spine 2-3 Views  02/23/2014   CLINICAL DATA:  Fall from ladder 2015 with back pain, initial encounter  EXAM: LUMBAR SPINE - 2-3 VIEW  COMPARISON:  None.  FINDINGS: Compression deformity of L1 is noted with approximately 50% vertebral body height loss anteriorly. No significant retropulsion is identified on these images. No other compression fracture is seen.  IMPRESSION: L1 compression deformity   Electronically Signed   By: Inez Catalina M.D.   On: 02/23/2014 15:50   Dg Pelvis 1-2 Views  02/23/2014   CLINICAL DATA:  Fall from ladder 2015, initial encounter  EXAM: PELVIS - 1-2 VIEW  COMPARISON:  None.  FINDINGS: There is no evidence of pelvic fracture or diastasis. No pelvic bone  lesions are seen.  IMPRESSION: No acute abnormality noted.   Electronically Signed   By: Inez Catalina M.D.   On: 02/23/2014 15:50   Dg Shoulder Right  02/23/2014   CLINICAL DATA:  From ladder 20 feet with right arm pain, initial encounter  EXAM: RIGHT SHOULDER - 2+ VIEW  COMPARISON:  None.  FINDINGS: There is a fracture through the surgical neck of the humeral head with mild impaction identified. The scapula appears within normal limits. The underlying ribs are within normal limits.  IMPRESSION: Proximal right humeral  fracture   Electronically Signed   By: Inez Catalina M.D.   On: 02/23/2014 15:46   Dg Elbow 2 Views Right  02/23/2014   CLINICAL DATA:  Fall from ladder 20 feet with elbow pain, initial encounter  EXAM: RIGHT ELBOW - 2 VIEW  COMPARISON:  None.  FINDINGS: There is a fracture through the radial head with the impaction and rotation of 1 of the fracture fragments out of the joint laterally. Joint effusion is noted.  IMPRESSION: Proximal radial head fracture with associated joint effusion.   Electronically Signed   By: Inez Catalina M.D.   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  Pain  EXAM: CT HEAD WITHOUT CONTRAST  CT CERVICAL SPINE WITHOUT CONTRAST  TECHNIQUE: Multidetector CT imaging of the head and cervical spine was performed following the standard protocol without intravenous contrast. Multiplanar CT image reconstructions of the cervical spine were also generated.  COMPARISON:  None.  FINDINGS: CT HEAD FINDINGS  The ventricles are normal in size and configuration. There is no mass, hemorrhage, extra-axial fluid collection, or midline shift. Gray-white compartments are normal. No acute infarct apparent.  On axial slice 15 series 3, there is a linear lucency in the medial occipital bone which does not traverse the entire DI the occipital bone. It is questioned whether this lucency represents a prominent vascular  groove versus an incomplete nondisplaced fracture. There is no displaced fracture. Mastoid air cells are clear.  CT CERVICAL SPINE FINDINGS  There is no cervical spine region fracture or spondylolisthesis. Prevertebral soft tissues and predental space regions are normal. There is mild disc space narrowing at C5-6. There is a small anterior osteophyte along the inferior aspect of the C5 vertebral body. There is facet hypertrophy at several levels bilaterally. There is a calcified left paracentral disc protrusion at C5-6 on the left which abuts the cord but does not cause significant impression on the cord. There is no other significant disc protrusion. No spinal stenosis.  IMPRESSION: CT head: Question vascular prominence versus incomplete fracture medial right occipital bone mid inferior portion. If patient is tender focally in this area, would advise considering this lucency to represent a nondisplaced incomplete fracture. No displaced fracture. No intracranial mass, hemorrhage, or extra-axial fluid. The gray-white compartments appear normal.  CT cervical spine: No fracture or spondylolisthesis in the cervical region. Asymmetric calcified disc protrusion on the left at C5-6. Facet hypertrophy at several levels. No frank spinal stenosis.   Electronically Signed   By: Lowella Grip M.D.   On: 02/23/2014 15:03   Ct Cervical Spine Wo Contrast  02/23/2014   CLINICAL DATA:  Patient fell 20 feet while cleaning gutters.  Pain  EXAM: CT HEAD WITHOUT CONTRAST  CT CERVICAL SPINE WITHOUT CONTRAST  TECHNIQUE: Multidetector CT imaging of the head and cervical spine was performed following the standard protocol without intravenous contrast. Multiplanar CT image reconstructions of the cervical spine were also generated.  COMPARISON:  None.  FINDINGS: CT HEAD FINDINGS  The ventricles are normal in size and configuration. There is no mass, hemorrhage, extra-axial fluid collection, or midline shift. Gray-white compartments  are normal. No acute infarct apparent.  On axial slice 15 series 3, there is a linear lucency in the medial occipital bone which does not traverse the entire DI the occipital bone. It is questioned whether this lucency represents a prominent vascular groove versus an incomplete nondisplaced fracture. There  is no displaced fracture. Mastoid air cells are clear.  CT CERVICAL SPINE FINDINGS  There is no cervical spine region fracture or spondylolisthesis. Prevertebral soft tissues and predental space regions are normal. There is mild disc space narrowing at C5-6. There is a small anterior osteophyte along the inferior aspect of the C5 vertebral body. There is facet hypertrophy at several levels bilaterally. There is a calcified left paracentral disc protrusion at C5-6 on the left which abuts the cord but does not cause significant impression on the cord. There is no other significant disc protrusion. No spinal stenosis.  IMPRESSION: CT head: Question vascular prominence versus incomplete fracture medial right occipital bone mid inferior portion. If patient is tender focally in this area, would advise considering this lucency to represent a nondisplaced incomplete fracture. No displaced fracture. No intracranial mass, hemorrhage, or extra-axial fluid. The gray-white compartments appear normal.  CT cervical spine: No fracture or spondylolisthesis in the cervical region. Asymmetric calcified disc protrusion on the left at C5-6. Facet hypertrophy at several levels. No frank spinal stenosis.   Electronically Signed   By: Lowella Grip M.D.   On: 02/23/2014 15:03   Ct Thoracic Spine Wo Contrast  02/23/2014   CLINICAL DATA:  Fall 20 feet while cleaning on gutters. Right proximal humeral fracture. Back pain  EXAM: CT THORACIC AND LUMBAR SPINE WITHOUT CONTRAST  TECHNIQUE: Multidetector CT imaging of the thoracic and lumbar spine was performed without contrast. Multiplanar CT image reconstructions were also generated.   COMPARISON:  Chest radiograph of 04/26/2013  FINDINGS: CT THORACIC SPINE FINDINGS  Small hiatal hernia noted.  No thoracic spine fracture or malalignment identified. Mild spurring anterior to the vertebral column at the T3-4 and T4-5 levels. No significant bony foraminal impingement.  CT LUMBAR SPINE FINDINGS  Acute 50% compression fracture of L1 observed with 7 mm posterior bony retropulsion the, resulting in mild central stenosis. No discrete foraminal impingement. No extension of the fracture into the posterior elements is observed. Paraspinal edema noted.  Transitional S1. Facet arthropathy and grade 1 anterolisthesis at L5-S1 along with an underlying disc bulge contribute to bilateral mild foraminal stenosis.  2 mm right mid kidney nonobstructive renal calculus.  IMPRESSION: 1. 50% compression fracture of L1 vertebral body with 7 mm of posterior bony retropulsion causing mild central stenosis. No posterior element involvement. Paraspinal edema noted. 2. Facet arthropathy in anterolisthesis at L5-S1 causing bilateral mild foraminal stenosis. 3. 2 mm right mid kidney nonobstructive renal calculus. 4. Small hiatal hernia. 5. No thoracic spine fracture identified. Minimal spurring anterior to the vertebral column at T3-4 and T4-5.   Electronically Signed   By: Sherryl Barters M.D.   On: 02/23/2014 19:12   Ct Lumbar Spine Wo Contrast  02/23/2014   CLINICAL DATA:  Fall 20 feet while cleaning on gutters. Right proximal humeral fracture. Back pain  EXAM: CT THORACIC AND LUMBAR SPINE WITHOUT CONTRAST  TECHNIQUE: Multidetector CT imaging of the thoracic and lumbar spine was performed without contrast. Multiplanar CT image reconstructions were also generated.  COMPARISON:  Chest radiograph of 04/26/2013  FINDINGS: CT THORACIC SPINE FINDINGS  Small hiatal hernia noted.  No thoracic spine fracture or malalignment identified. Mild spurring anterior to the vertebral column at the T3-4 and T4-5 levels. No significant bony  foraminal impingement.  CT LUMBAR SPINE FINDINGS  Acute 50% compression fracture of L1 observed with 7 mm posterior bony retropulsion the, resulting in mild central stenosis. No discrete foraminal impingement. No extension of the fracture into the  posterior elements is observed. Paraspinal edema noted.  Transitional S1. Facet arthropathy and grade 1 anterolisthesis at L5-S1 along with an underlying disc bulge contribute to bilateral mild foraminal stenosis.  2 mm right mid kidney nonobstructive renal calculus.  IMPRESSION: 1. 50% compression fracture of L1 vertebral body with 7 mm of posterior bony retropulsion causing mild central stenosis. No posterior element involvement. Paraspinal edema noted. 2. Facet arthropathy in anterolisthesis at L5-S1 causing bilateral mild foraminal stenosis. 3. 2 mm right mid kidney nonobstructive renal calculus. 4. Small hiatal hernia. 5. No thoracic spine fracture identified. Minimal spurring anterior to the vertebral column at T3-4 and T4-5.   Electronically Signed   By: Sherryl Barters M.D.   On: 02/23/2014 19:12   Ct Abdomen Pelvis W Contrast  02/23/2014   CLINICAL DATA:  53 year old male with history of trauma from a fall approximately 20 feet while cleaning out gutters. Fell onto right shoulder with obvious right shoulder deformity. Lower back stiffness.  EXAM: CT ABDOMEN AND PELVIS WITH CONTRAST  TECHNIQUE: Multidetector CT imaging of the abdomen and pelvis was performed using the standard protocol following bolus administration of intravenous contrast.  CONTRAST:  138mL OMNIPAQUE IOHEXOL 300 MG/ML  SOLN  COMPARISON:  CT of the abdomen and pelvis 04/27/2011.  FINDINGS: Lower chest:  Small hiatal hernia.  Otherwise, unremarkable.  Hepatobiliary: 6 mm low attenuation lesion in segment 8 of the liver is too small to characterize, but unchanged compared to prior study 04/27/2011, presumably a tiny cyst. No other suspicious appearing cystic or solid hepatic lesion. No signs of  acute trauma to the liver. No intra or extrahepatic biliary ductal dilatation. Gallbladder is normal in appearance.  Pancreas: Unremarkable.  Spleen: No signs of acute trauma to the spleen.  Unremarkable.  Adrenals/Urinary Tract: Bilateral adrenal glands and bilateral kidneys are normal in appearance. Specifically, no signs of acute trauma to either kidney. No hydroureteronephrosis. Urinary bladder is normal in appearance.  Stomach/Bowel: Normal appearance of the stomach. No pathologic dilatation of small bowel or colon. A few scattered colonic diverticulae are noted, without surrounding inflammatory changes to suggest an acute diverticulitis at this time. Normal appendix. No definite signs of acute trauma to the bowel or mesentery.  Vascular/Lymphatic: No evidence of an acute traumatic injury to the major abdominal or pelvic arteries. No significant atherosclerotic disease or aneurysm. No lymphadenopathy identified in the abdomen or pelvis.  Reproductive: Prostate gland and seminal vesicles are unremarkable in appearance.  Other: No high attenuation fluid collection within in the peritoneal cavity or retroperitoneum to suggest significant posttraumatic hemorrhage. No significant volume of ascites. No pneumoperitoneum.  Musculoskeletal: There is an acute burst fracture of L1 with 4 mm of retropulsion of posterior fracture fragments which slightly narrows the central spinal canal, and approximately 60% loss of anterior vertebral body height. There are also nondisplaced fractures through the right superior and inferior pubic rami, the superior pubic ramus fracture is extremely subtle (noted only on coronal reformats). There are no aggressive appearing lytic or blastic lesions noted in the visualized portions of the skeleton.  IMPRESSION: 1. Acute burst fracture of L1 with approximately 60% loss of anterior vertebral body height and 4 mm of retropulsion of fracture fragments which slightly narrows the central spinal  canal. 2. Nondisplaced fractures of the right superior and inferior pubic rami. 3. No other findings of significant acute traumatic injury to the abdomen or pelvis. 4. Colonic diverticulosis without findings to suggest acute diverticulitis at this time. These results were called by telephone  at the time of interpretation on 02/23/2014 at 7:23 pm to Dr. Melchor Amour, who verbally acknowledged these results.   Electronically Signed   By: Vinnie Langton M.D.   On: 02/23/2014 19:25   Ct Shoulder Right Wo Contrast  02/23/2014   CLINICAL DATA:  Right shoulder deformity after falling 20 feet while cleaning gutters.  EXAM: CT OF THE RIGHT SHOULDER WITHOUT CONTRAST  TECHNIQUE: Multidetector CT imaging was performed according to the standard protocol. Multiplanar CT image reconstructions were also generated.  COMPARISON:  Radiographs of same day.  FINDINGS: Mildly comminuted oblique fracture is seen involving the proximal left humeral neck with moderate anterior displacement of the humeral shaft relative to the humeral head. This appears to be closed and posttraumatic. Visualized ribs and scapula appear normal. The clavicle appears normal. Glenohumeral and acromioclavicular joints appear normal. Visualized portions of right lung appear normal.  IMPRESSION: Mildly comminuted and moderately displaced oblique fracture of the proximal left humeral neck is noted. This is the initial encounter.   Electronically Signed   By: Sabino Dick M.D.   On: 02/23/2014 18:44   Ct Elbow Right W/o Cm  02/23/2014   CLINICAL DATA:  Acute right elbow pain after falling 20 feet cleaning gutters.  EXAM: CT OF THE RIGHT ELBOW WITHOUT CONTRAST  TECHNIQUE: Multidetector CT imaging was performed according to the standard protocol. Multiplanar CT image reconstructions were also generated.  COMPARISON:  None.  FINDINGS: The visualized portion of the distal humerus appears normal. Probable minimally displaced fracture of coronoid process of proximal  ulna is noted. The olecranon appears normal. However, moderately comminuted fracture is seen involving the radial head with moderate posterior displacement of the largest fracture fragment. This fragment also appears to be rotated.  IMPRESSION: Comminuted fracture of the proximal right radial head is noted with moderate displacement and rotation of largest fracture fragment. Probable minimally displaced fracture involving coronoid process of proximal ulna.   Electronically Signed   By: Sabino Dick M.D.   On: 02/23/2014 19:11   Dg Humerus Right  02/23/2014   CLINICAL DATA:  Fall from ladder 20 feet, initial encounter  EXAM: RIGHT HUMERUS - 2+ VIEW  COMPARISON:  None.  FINDINGS: There is again noted a fracture through the surgical neck of the proximal humerus. Additionally there is fracture identified along the distal aspect of the humerus which appears to arise from the radial head. The fractures rotated out of the joint laterally.  IMPRESSION: Proximal right humeral fracture as well as the right radial head fracture with rotation of the fracture fragment out of the joint.   Electronically Signed   By: Inez Catalina M.D.   On: 02/23/2014 15:48     EKG Interpretation None      MDM   Final diagnoses:  Fall    53 y.o. male without pertinent PMH presents with R shoulder pain after fall from ~2 stories.  Pt landed on his R side. He denies LOC, but has a very small period of surrounding amnesia.  He did have fecal incontinence after the fall.  On arrival, primary survey as above intact, secondary survey as above. Pt removed from [place] on arrival and laid flat as precaution against neurologic compromise from possible spinal fracture.  Maintained ccollar throughout visit.  Imaging revealed proximal humerus fx, with radial head fx.  XR of spine with l1 fracture.  CT scan ordered of abd and spine.  Care to Dr. Betsey Holiday pending final dispo.    I have reviewed all  laboratory and imaging studies if ordered as  above  1. Fall   2. L1 Burst fracture    Debby Freiberg, MD 02/24/14 [phone]

## 2014-02-24 LAB — BASIC METABOLIC PANEL
Anion gap: 12 (ref 5–15)
BUN: 18 mg/dL (ref 6–23)
CO2: 25 mEq/L (ref 19–32)
CREATININE: 1.07 mg/dL (ref 0.50–1.35)
Calcium: 8.6 mg/dL (ref 8.4–10.5)
Chloride: 101 mEq/L (ref 96–112)
GFR calc Af Amer: >90 mL/min (ref >90)
GFR, EST NON AFRICAN AMERICAN: 79 mL/min — AB (ref >90)
GLUCOSE: 145 mg/dL — AB (ref 70–99)
Potassium: 4.2 mEq/L (ref 3.7–5.3)
SODIUM: 138 meq/L (ref 137–147)

## 2014-02-24 LAB — CBC
HCT: 35.7 % — ABNORMAL LOW (ref 39.0–52.0)
HEMOGLOBIN: 12 g/dL — AB (ref 13.0–17.0)
MCH: 30.1 pg (ref 26.0–34.0)
MCHC: 33.6 g/dL (ref 30.0–36.0)
MCV: 89.5 fL (ref 78.0–100.0)
Platelets: 204 10*3/uL (ref 150–400)
RBC: 3.99 MIL/uL — ABNORMAL LOW (ref 4.22–5.81)
RDW: 13.2 % (ref 11.5–15.5)
WBC: 9.7 10*3/uL (ref 4.0–10.5)

## 2014-02-24 LAB — GLUCOSE, CAPILLARY: GLUCOSE-CAPILLARY: 160 mg/dL — AB (ref 70–99)

## 2014-02-24 LAB — MRSA PCR SCREENING: MRSA BY PCR: NEGATIVE

## 2014-02-24 MED ORDER — CITALOPRAM HYDROBROMIDE 20 MG PO TABS
20.0000 mg | ORAL_TABLET | Freq: Every day | ORAL | Status: DC
  Administered 2014-02-24 – 2014-02-25 (×2): 20 mg via ORAL
  Filled 2014-02-24 (×5): qty 1

## 2014-02-24 MED ORDER — MORPHINE SULFATE 2 MG/ML IJ SOLN
1.0000 mg | INTRAMUSCULAR | Status: DC | PRN
  Administered 2014-02-24 – 2014-02-26 (×13): 2 mg via INTRAVENOUS
  Filled 2014-02-24 (×13): qty 1

## 2014-02-24 MED ORDER — ACETAMINOPHEN 325 MG PO TABS: 650.0000 mg | ORAL_TABLET | ORAL | Status: DC | PRN

## 2014-02-24 MED ORDER — ONDANSETRON HCL 4 MG PO TABS: 4.0000 mg | ORAL_TABLET | Freq: Four times a day (QID) | ORAL | Status: DC | PRN

## 2014-02-24 MED ORDER — SODIUM CHLORIDE 0.9 % IV SOLN: 250.0000 mL | INTRAVENOUS | Status: DC | PRN

## 2014-02-24 MED ORDER — DOCUSATE SODIUM 100 MG PO CAPS
100.0000 mg | ORAL_CAPSULE | Freq: Two times a day (BID) | ORAL | Status: DC
  Administered 2014-02-24 (×2): 100 mg via ORAL
  Filled 2014-02-24 (×5): qty 1

## 2014-02-24 MED ORDER — SODIUM CHLORIDE 0.9 % IJ SOLN: 3.0000 mL | INTRAMUSCULAR | Status: DC | PRN

## 2014-02-24 MED ORDER — SODIUM CHLORIDE 0.9 % IJ SOLN
3.0000 mL | Freq: Two times a day (BID) | INTRAMUSCULAR | Status: DC
  Administered 2014-02-25 – 2014-02-26 (×2): 3 mL via INTRAVENOUS

## 2014-02-24 MED ORDER — OXYCODONE HCL 5 MG PO TABS
5.0000 mg | ORAL_TABLET | ORAL | Status: DC | PRN
  Administered 2014-02-24 – 2014-02-26 (×8): 10 mg via ORAL
  Filled 2014-02-24 (×8): qty 2

## 2014-02-24 MED ORDER — ONDANSETRON HCL 4 MG/2ML IJ SOLN: 4.0000 mg | Freq: Four times a day (QID) | INTRAMUSCULAR | Status: DC | PRN

## 2014-02-24 MED ORDER — DIPHENHYDRAMINE HCL 25 MG PO CAPS: 25.0000 mg | ORAL_CAPSULE | Freq: Four times a day (QID) | ORAL | Status: DC | PRN

## 2014-02-24 MED ORDER — POTASSIUM CHLORIDE IN NACL 20-0.9 MEQ/L-% IV SOLN
INTRAVENOUS | Status: DC
  Administered 2014-02-24 (×2): via INTRAVENOUS
  Filled 2014-02-24 (×4): qty 1000

## 2014-02-24 MED ORDER — BISACODYL 10 MG RE SUPP: 10.0000 mg | Freq: Every day | RECTAL | Status: DC | PRN

## 2014-02-24 MED ORDER — CEFAZOLIN SODIUM-DEXTROSE 2-3 GM-% IV SOLR
2.0000 g | INTRAVENOUS | Status: DC
  Filled 2014-02-24: qty 50

## 2014-02-24 NOTE — Progress Notes (Signed)
Trauma Service Note  Subjective: Patient is in good spirits.  Objective: Vital signs in last 24 hours: Temp:  [98 F (36.7 C)-99.7 F (37.6 C)] 98.6 F (37 C) (12/16 0816) Pulse Rate:  [68-86] 78 (12/16 0800) Resp:  [13-22] 18 (12/16 0800) BP: (105-152)/(54-91) 146/86 mmHg (12/16 0800) SpO2:  [92 %-100 %] 97 % (12/16 0800) Weight:  [81.647 kg (180 lb)-83.6 kg (184 lb 4.9 oz)] 83.6 kg (184 lb 4.9 oz) (12/16 0000) Last BM Date: 02/23/14  Intake/Output from previous day: 12/15 0701 - 12/16 0700 In: 249.2 [I.V.:249.2] Out: -  Intake/Output this shift: Total I/O In: 290 [P.O.:240; I.V.:50] Out: 400 [Urine:400]  General: No acute distress at rest.  Pain 2-4/10  Lungs: Clear to auscultation  Abd: Soft, distended, good bowel sounds.  No apparent ileus.  Extremities: No lower extremity problems.  Right arm in sling  Family wants to know if surgery could be done sooner  Neuro: Intact.  Back pain.  Lab Results: CBC   Recent Labs  02/23/14 1337 02/24/14 0242  WBC 16.4* 9.7  HGB 13.7 12.0*  HCT 39.7 35.7*  PLT 207 204   BMET  Recent Labs  02/23/14 1337 02/24/14 0242  NA 139 138  K 3.6* 4.2  CL 101 101  CO2 22 25  GLUCOSE 136* 145*  BUN 16 18  CREATININE 1.17 1.07  CALCIUM 9.2 8.6   PT/INR No results for input(s): LABPROT, INR in the last 72 hours. ABG No results for input(s): PHART, HCO3 in the last 72 hours.  Invalid input(s): PCO2, PO2  Studies/Results: Dg Chest 2 View  02/23/2014   CLINICAL DATA:  Fall from ladder 20 feet landing on right side with pain, initial encounter  EXAM: CHEST  2 VIEW  COMPARISON:  None.  FINDINGS: The heart size and mediastinal contours are within normal limits. Both lungs are clear. The visualized skeletal structures are unremarkable.  IMPRESSION: No active cardiopulmonary disease.   Electronically Signed   By: Inez Catalina M.D.   On: 02/23/2014 15:41   Dg Thoracic Spine W/swimmers  02/23/2014   CLINICAL DATA:  Fall 20  feet from ladder, initial encounter  EXAM: THORACIC SPINE - 2 VIEW + SWIMMERS  COMPARISON:  None.  FINDINGS: Compression deformity of L1 is again noted. No other compression deformities are seen. No gross soft tissue abnormality is noted.  IMPRESSION: L1 compression deformity. No other compression deformities are noted.   Electronically Signed   By: Inez Catalina M.D.   On: 02/23/2014 15:51   Dg Lumbar Spine 2-3 Views  02/23/2014   CLINICAL DATA:  Fall from ladder 2015 with back pain, initial encounter  EXAM: LUMBAR SPINE - 2-3 VIEW  COMPARISON:  None.  FINDINGS: Compression deformity of L1 is noted with approximately 50% vertebral body height loss anteriorly. No significant retropulsion is identified on these images. No other compression fracture is seen.  IMPRESSION: L1 compression deformity   Electronically Signed   By: Inez Catalina M.D.   On: 02/23/2014 15:50   Dg Pelvis 1-2 Views  02/23/2014   CLINICAL DATA:  Fall from ladder 2015, initial encounter  EXAM: PELVIS - 1-2 VIEW  COMPARISON:  None.  FINDINGS: There is no evidence of pelvic fracture or diastasis. No pelvic bone lesions are seen.  IMPRESSION: No acute abnormality noted.   Electronically Signed   By: Inez Catalina M.D.   On: 02/23/2014 15:50   Dg Shoulder Right  02/23/2014   CLINICAL DATA:  From ladder 20  feet with right arm pain, initial encounter  EXAM: RIGHT SHOULDER - 2+ VIEW  COMPARISON:  None.  FINDINGS: There is a fracture through the surgical neck of the humeral head with mild impaction identified. The scapula appears within normal limits. The underlying ribs are within normal limits.  IMPRESSION: Proximal right humeral fracture   Electronically Signed   By: Inez Catalina M.D.   On: 02/23/2014 15:46   Dg Elbow 2 Views Right  02/23/2014   CLINICAL DATA:  Fall from ladder 20 feet with elbow pain, initial encounter  EXAM: RIGHT ELBOW - 2 VIEW  COMPARISON:  None.  FINDINGS: There is a fracture through the radial head with the impaction  and rotation of 1 of the fracture fragments out of the joint laterally. Joint effusion is noted.  IMPRESSION: Proximal radial head fracture with associated joint effusion.   Electronically Signed   By: Inez Catalina M.D.   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  Pain  EXAM: CT HEAD WITHOUT CONTRAST  CT CERVICAL SPINE WITHOUT CONTRAST  TECHNIQUE: Multidetector CT imaging of the head and cervical spine was performed following the standard protocol without intravenous contrast. Multiplanar CT image reconstructions of the cervical spine were also generated.  COMPARISON:  None.  FINDINGS: CT HEAD FINDINGS  The ventricles are normal in size and configuration. There is no mass, hemorrhage, extra-axial fluid collection, or midline shift. Gray-white compartments are normal. No acute infarct apparent.  On axial slice 15 series 3, there is a linear lucency in the medial occipital bone which does not traverse the entire DI the occipital bone. It is questioned whether this lucency represents a prominent vascular groove versus an incomplete nondisplaced fracture. There is no displaced fracture. Mastoid air cells are clear.  CT CERVICAL SPINE FINDINGS  There is no cervical spine region fracture or spondylolisthesis. Prevertebral soft tissues and predental space regions are normal. There is mild disc space narrowing at C5-6. There is a small anterior osteophyte along the inferior aspect of the C5 vertebral body. There is facet hypertrophy at several levels bilaterally. There is a calcified left paracentral disc protrusion at C5-6 on the left which abuts the cord but does not cause significant impression on the cord. There is no other significant disc protrusion. No spinal stenosis.  IMPRESSION: CT head: Question vascular prominence versus incomplete fracture medial right occipital bone mid inferior portion. If patient is tender focally in this area,  would advise considering this lucency to represent a nondisplaced incomplete fracture. No displaced fracture. No intracranial mass, hemorrhage, or extra-axial fluid. The gray-white compartments appear normal.  CT cervical spine: No fracture or spondylolisthesis in the cervical region. Asymmetric calcified disc protrusion on the left at C5-6. Facet hypertrophy at several levels. No frank spinal stenosis.   Electronically Signed   By: Lowella Grip M.D.   On: 02/23/2014 15:03   Ct Cervical Spine Wo Contrast  02/23/2014   CLINICAL DATA:  Patient fell 20 feet while cleaning gutters.  Pain  EXAM: CT HEAD WITHOUT CONTRAST  CT CERVICAL SPINE WITHOUT CONTRAST  TECHNIQUE: Multidetector CT imaging of the head and cervical spine was performed following the standard protocol without intravenous contrast. Multiplanar CT image reconstructions of the cervical spine were also generated.  COMPARISON:  None.  FINDINGS: CT HEAD FINDINGS  The ventricles are normal in size and configuration. There is no mass, hemorrhage, extra-axial fluid collection, or  midline shift. Gray-white compartments are normal. No acute infarct apparent.  On axial slice 15 series 3, there is a linear lucency in the medial occipital bone which does not traverse the entire DI the occipital bone. It is questioned whether this lucency represents a prominent vascular groove versus an incomplete nondisplaced fracture. There is no displaced fracture. Mastoid air cells are clear.  CT CERVICAL SPINE FINDINGS  There is no cervical spine region fracture or spondylolisthesis. Prevertebral soft tissues and predental space regions are normal. There is mild disc space narrowing at C5-6. There is a small anterior osteophyte along the inferior aspect of the C5 vertebral body. There is facet hypertrophy at several levels bilaterally. There is a calcified left paracentral disc protrusion at C5-6 on the left which abuts the cord but does not cause significant impression on  the cord. There is no other significant disc protrusion. No spinal stenosis.  IMPRESSION: CT head: Question vascular prominence versus incomplete fracture medial right occipital bone mid inferior portion. If patient is tender focally in this area, would advise considering this lucency to represent a nondisplaced incomplete fracture. No displaced fracture. No intracranial mass, hemorrhage, or extra-axial fluid. The gray-white compartments appear normal.  CT cervical spine: No fracture or spondylolisthesis in the cervical region. Asymmetric calcified disc protrusion on the left at C5-6. Facet hypertrophy at several levels. No frank spinal stenosis.   Electronically Signed   By: Lowella Grip M.D.   On: 02/23/2014 15:03   Ct Thoracic Spine Wo Contrast  02/23/2014   CLINICAL DATA:  Fall 20 feet while cleaning on gutters. Right proximal humeral fracture. Back pain  EXAM: CT THORACIC AND LUMBAR SPINE WITHOUT CONTRAST  TECHNIQUE: Multidetector CT imaging of the thoracic and lumbar spine was performed without contrast. Multiplanar CT image reconstructions were also generated.  COMPARISON:  Chest radiograph of 04/26/2013  FINDINGS: CT THORACIC SPINE FINDINGS  Small hiatal hernia noted.  No thoracic spine fracture or malalignment identified. Mild spurring anterior to the vertebral column at the T3-4 and T4-5 levels. No significant bony foraminal impingement.  CT LUMBAR SPINE FINDINGS  Acute 50% compression fracture of L1 observed with 7 mm posterior bony retropulsion the, resulting in mild central stenosis. No discrete foraminal impingement. No extension of the fracture into the posterior elements is observed. Paraspinal edema noted.  Transitional S1. Facet arthropathy and grade 1 anterolisthesis at L5-S1 along with an underlying disc bulge contribute to bilateral mild foraminal stenosis.  2 mm right mid kidney nonobstructive renal calculus.  IMPRESSION: 1. 50% compression fracture of L1 vertebral body with 7 mm of  posterior bony retropulsion causing mild central stenosis. No posterior element involvement. Paraspinal edema noted. 2. Facet arthropathy in anterolisthesis at L5-S1 causing bilateral mild foraminal stenosis. 3. 2 mm right mid kidney nonobstructive renal calculus. 4. Small hiatal hernia. 5. No thoracic spine fracture identified. Minimal spurring anterior to the vertebral column at T3-4 and T4-5.   Electronically Signed   By: Sherryl Barters M.D.   On: 02/23/2014 19:12   Ct Lumbar Spine Wo Contrast  02/23/2014   CLINICAL DATA:  Fall 20 feet while cleaning on gutters. Right proximal humeral fracture. Back pain  EXAM: CT THORACIC AND LUMBAR SPINE WITHOUT CONTRAST  TECHNIQUE: Multidetector CT imaging of the thoracic and lumbar spine was performed without contrast. Multiplanar CT image reconstructions were also generated.  COMPARISON:  Chest radiograph of 04/26/2013  FINDINGS: CT THORACIC SPINE FINDINGS  Small hiatal hernia noted.  No thoracic spine fracture or malalignment identified.  Mild spurring anterior to the vertebral column at the T3-4 and T4-5 levels. No significant bony foraminal impingement.  CT LUMBAR SPINE FINDINGS  Acute 50% compression fracture of L1 observed with 7 mm posterior bony retropulsion the, resulting in mild central stenosis. No discrete foraminal impingement. No extension of the fracture into the posterior elements is observed. Paraspinal edema noted.  Transitional S1. Facet arthropathy and grade 1 anterolisthesis at L5-S1 along with an underlying disc bulge contribute to bilateral mild foraminal stenosis.  2 mm right mid kidney nonobstructive renal calculus.  IMPRESSION: 1. 50% compression fracture of L1 vertebral body with 7 mm of posterior bony retropulsion causing mild central stenosis. No posterior element involvement. Paraspinal edema noted. 2. Facet arthropathy in anterolisthesis at L5-S1 causing bilateral mild foraminal stenosis. 3. 2 mm right mid kidney nonobstructive renal  calculus. 4. Small hiatal hernia. 5. No thoracic spine fracture identified. Minimal spurring anterior to the vertebral column at T3-4 and T4-5.   Electronically Signed   By: Sherryl Barters M.D.   On: 02/23/2014 19:12   Ct Abdomen Pelvis W Contrast  02/23/2014   CLINICAL DATA:  53 year old male with history of trauma from a fall approximately 20 feet while cleaning out gutters. Fell onto right shoulder with obvious right shoulder deformity. Lower back stiffness.  EXAM: CT ABDOMEN AND PELVIS WITH CONTRAST  TECHNIQUE: Multidetector CT imaging of the abdomen and pelvis was performed using the standard protocol following bolus administration of intravenous contrast.  CONTRAST:  150mL OMNIPAQUE IOHEXOL 300 MG/ML  SOLN  COMPARISON:  CT of the abdomen and pelvis 04/27/2011.  FINDINGS: Lower chest:  Small hiatal hernia.  Otherwise, unremarkable.  Hepatobiliary: 6 mm low attenuation lesion in segment 8 of the liver is too small to characterize, but unchanged compared to prior study 04/27/2011, presumably a tiny cyst. No other suspicious appearing cystic or solid hepatic lesion. No signs of acute trauma to the liver. No intra or extrahepatic biliary ductal dilatation. Gallbladder is normal in appearance.  Pancreas: Unremarkable.  Spleen: No signs of acute trauma to the spleen.  Unremarkable.  Adrenals/Urinary Tract: Bilateral adrenal glands and bilateral kidneys are normal in appearance. Specifically, no signs of acute trauma to either kidney. No hydroureteronephrosis. Urinary bladder is normal in appearance.  Stomach/Bowel: Normal appearance of the stomach. No pathologic dilatation of small bowel or colon. A few scattered colonic diverticulae are noted, without surrounding inflammatory changes to suggest an acute diverticulitis at this time. Normal appendix. No definite signs of acute trauma to the bowel or mesentery.  Vascular/Lymphatic: No evidence of an acute traumatic injury to the major abdominal or pelvic  arteries. No significant atherosclerotic disease or aneurysm. No lymphadenopathy identified in the abdomen or pelvis.  Reproductive: Prostate gland and seminal vesicles are unremarkable in appearance.  Other: No high attenuation fluid collection within in the peritoneal cavity or retroperitoneum to suggest significant posttraumatic hemorrhage. No significant volume of ascites. No pneumoperitoneum.  Musculoskeletal: There is an acute burst fracture of L1 with 4 mm of retropulsion of posterior fracture fragments which slightly narrows the central spinal canal, and approximately 60% loss of anterior vertebral body height. There are also nondisplaced fractures through the right superior and inferior pubic rami, the superior pubic ramus fracture is extremely subtle (noted only on coronal reformats). There are no aggressive appearing lytic or blastic lesions noted in the visualized portions of the skeleton.  IMPRESSION: 1. Acute burst fracture of L1 with approximately 60% loss of anterior vertebral body height and 4 mm of  retropulsion of fracture fragments which slightly narrows the central spinal canal. 2. Nondisplaced fractures of the right superior and inferior pubic rami. 3. No other findings of significant acute traumatic injury to the abdomen or pelvis. 4. Colonic diverticulosis without findings to suggest acute diverticulitis at this time. These results were called by telephone at the time of interpretation on 02/23/2014 at 7:23 pm to Dr. Melchor Amour, who verbally acknowledged these results.   Electronically Signed   By: Vinnie Langton M.D.   On: 02/23/2014 19:25   Ct Shoulder Right Wo Contrast  02/23/2014   CLINICAL DATA:  Right shoulder deformity after falling 20 feet while cleaning gutters.  EXAM: CT OF THE RIGHT SHOULDER WITHOUT CONTRAST  TECHNIQUE: Multidetector CT imaging was performed according to the standard protocol. Multiplanar CT image reconstructions were also generated.  COMPARISON:  Radiographs of  same day.  FINDINGS: Mildly comminuted oblique fracture is seen involving the proximal left humeral neck with moderate anterior displacement of the humeral shaft relative to the humeral head. This appears to be closed and posttraumatic. Visualized ribs and scapula appear normal. The clavicle appears normal. Glenohumeral and acromioclavicular joints appear normal. Visualized portions of right lung appear normal.  IMPRESSION: Mildly comminuted and moderately displaced oblique fracture of the proximal left humeral neck is noted. This is the initial encounter.   Electronically Signed   By: Sabino Dick M.D.   On: 02/23/2014 18:44   Ct Elbow Right W/o Cm  02/23/2014   CLINICAL DATA:  Acute right elbow pain after falling 20 feet cleaning gutters.  EXAM: CT OF THE RIGHT ELBOW WITHOUT CONTRAST  TECHNIQUE: Multidetector CT imaging was performed according to the standard protocol. Multiplanar CT image reconstructions were also generated.  COMPARISON:  None.  FINDINGS: The visualized portion of the distal humerus appears normal. Probable minimally displaced fracture of coronoid process of proximal ulna is noted. The olecranon appears normal. However, moderately comminuted fracture is seen involving the radial head with moderate posterior displacement of the largest fracture fragment. This fragment also appears to be rotated.  IMPRESSION: Comminuted fracture of the proximal right radial head is noted with moderate displacement and rotation of largest fracture fragment. Probable minimally displaced fracture involving coronoid process of proximal ulna.   Electronically Signed   By: Sabino Dick M.D.   On: 02/23/2014 19:11   Dg Humerus Right  02/23/2014   CLINICAL DATA:  Fall from ladder 20 feet, initial encounter  EXAM: RIGHT HUMERUS - 2+ VIEW  COMPARISON:  None.  FINDINGS: There is again noted a fracture through the surgical neck of the proximal humerus. Additionally there is fracture identified along the distal aspect  of the humerus which appears to arise from the radial head. The fractures rotated out of the joint laterally.  IMPRESSION: Proximal right humeral fracture as well as the right radial head fracture with rotation of the fracture fragment out of the joint.   Electronically Signed   By: Inez Catalina M.D.   On: 02/23/2014 15:48    Anti-infectives: Anti-infectives    None      Assessment/Plan: s/p  Advance diet Needs TLSO brace before ambulation  Order PT   LOS: 1 day   Kathryne Eriksson. Dahlia Bailiff, MD, FACS [phone] Trauma Surgeon 02/24/2014

## 2014-02-24 NOTE — Progress Notes (Signed)
Patient ID: Vincent [name], male   DOB: 29-Aug-1960, 53 y.o.   MRN: [id number] Afeb, vss No new neuro issues Brace has arrived so he can start to mobilize with PT. He DOES NOT need to wear the brace in bed!!

## 2014-02-24 NOTE — Progress Notes (Signed)
UR completed.  Karmin Kasprzak, RN BSN [facility] CCM Trauma/Neuro ICU Case Manager [phone]  

## 2014-02-24 NOTE — Progress Notes (Signed)
Orthopedic Tech Progress Note Patient Details:  Reeder Brisby 09/07/1960 [id number]  Called in bio-tech brace order; spoke with Dolores Lory, Sandra Brents 02/24/2014, 11:28 AM

## 2014-02-24 NOTE — Progress Notes (Signed)
   PATIENT ID: Vincent [name]        Subjective: Arm and back sore, but pain well controlled.  Brace ordered for L1 fracture.  Objective:  Filed Vitals:   02/24/14 1100  BP: 136/83  Pulse: 79  Temp:   Resp: 19     R shoulder swollen skin intact elbow splinted. Sling. NVID.   Labs:   Recent Labs  02/23/14 1337 02/24/14 0242  HGB 13.7 12.0*   Recent Labs  02/23/14 1337 02/24/14 0242  WBC 16.4* 9.7  RBC 4.46 3.99*  HCT 39.7 35.7*  PLT 207 204   Recent Labs  02/23/14 1337 02/24/14 0242  NA 139 138  K 3.6* 4.2  CL 101 101  CO2 22 25  BUN 16 18  CREATININE 1.17 1.07  GLUCOSE 136* 145*  CALCIUM 9.2 8.6    Assessment and Plan: R displaced proximal humerus fracture and comminuted radial head fracture:  Plan ORIF prox hum fracture and radial head replacement tomorrow.  R/B discussed with patient and wife at bedside.  NPO after midnight.   He is getting TLSO today and we will be able to keep him in brace during surgery Non displaced rami fractures: OK for WBAT

## 2014-02-25 ENCOUNTER — Inpatient Hospital Stay (HOSPITAL_COMMUNITY): Payer: BC Managed Care – PPO

## 2014-02-25 ENCOUNTER — Encounter (HOSPITAL_COMMUNITY): Admission: EM | Disposition: A | Discharge status: Home Health | Payer: Self-pay | Source: Home / Self Care

## 2014-02-25 ENCOUNTER — Inpatient Hospital Stay (HOSPITAL_COMMUNITY): Payer: BC Managed Care – PPO | Admitting: Certified Registered Nurse Anesthetist

## 2014-02-25 ENCOUNTER — Encounter (HOSPITAL_COMMUNITY): Payer: Self-pay | Admitting: Certified Registered Nurse Anesthetist

## 2014-02-25 DIAGNOSIS — S42209A Unspecified fracture of upper end of unspecified humerus, initial encounter for closed fracture: Secondary | ICD-10-CM | POA: Diagnosis present

## 2014-02-25 HISTORY — PX: ORIF HUMERUS FRACTURE: SHX2126

## 2014-02-25 HISTORY — PX: RADIAL HEAD ARTHROPLASTY: SHX6044

## 2014-02-25 SURGERY — OPEN REDUCTION INTERNAL FIXATION (ORIF) PROXIMAL HUMERUS FRACTURE
Anesthesia: General | Site: Shoulder | Laterality: Right

## 2014-02-25 MED ORDER — ONDANSETRON HCL 4 MG PO TABS: 4.0000 mg | ORAL_TABLET | Freq: Four times a day (QID) | ORAL | Status: DC | PRN

## 2014-02-25 MED ORDER — PHENOL 1.4 % MT LIQD: 1.0000 | OROMUCOSAL | Status: DC | PRN

## 2014-02-25 MED ORDER — SODIUM CHLORIDE 0.9 % IV SOLN
INTRAVENOUS | Status: DC
  Administered 2014-02-26: 06:00:00 via INTRAVENOUS

## 2014-02-25 MED ORDER — HYDROMORPHONE HCL 1 MG/ML IJ SOLN: 0.2500 mg | INTRAMUSCULAR | Status: DC | PRN

## 2014-02-25 MED ORDER — LIDOCAINE HCL (CARDIAC) 20 MG/ML IV SOLN
INTRAVENOUS | Status: DC | PRN
  Administered 2014-02-25: 100 mg via INTRAVENOUS

## 2014-02-25 MED ORDER — SODIUM CHLORIDE 0.9 % IJ SOLN
INTRAMUSCULAR | Status: AC
  Filled 2014-02-25: qty 10

## 2014-02-25 MED ORDER — DOCUSATE SODIUM 100 MG PO CAPS
100.0000 mg | ORAL_CAPSULE | Freq: Two times a day (BID) | ORAL | Status: DC
  Administered 2014-02-25 – 2014-02-26 (×2): 100 mg via ORAL
  Filled 2014-02-25 (×3): qty 1

## 2014-02-25 MED ORDER — MIDAZOLAM HCL 2 MG/2ML IJ SOLN
INTRAMUSCULAR | Status: AC
  Filled 2014-02-25: qty 2

## 2014-02-25 MED ORDER — MIDAZOLAM HCL 5 MG/5ML IJ SOLN
INTRAMUSCULAR | Status: DC | PRN
  Administered 2014-02-25: 2 mg via INTRAVENOUS

## 2014-02-25 MED ORDER — LACTATED RINGERS IV SOLN
INTRAVENOUS | Status: DC | PRN
  Administered 2014-02-25 (×3): via INTRAVENOUS

## 2014-02-25 MED ORDER — METOCLOPRAMIDE HCL 5 MG/ML IJ SOLN: 5.0000 mg | Freq: Three times a day (TID) | INTRAMUSCULAR | Status: DC | PRN

## 2014-02-25 MED ORDER — FENTANYL CITRATE 0.05 MG/ML IJ SOLN
INTRAMUSCULAR | Status: DC | PRN
  Administered 2014-02-25: 50 ug via INTRAVENOUS
  Administered 2014-02-25: 100 ug via INTRAVENOUS
  Administered 2014-02-25 (×2): 50 ug via INTRAVENOUS

## 2014-02-25 MED ORDER — ROCURONIUM BROMIDE 50 MG/5ML IV SOLN
INTRAVENOUS | Status: AC
  Filled 2014-02-25: qty 1

## 2014-02-25 MED ORDER — DIPHENHYDRAMINE HCL 12.5 MG/5ML PO ELIX: 12.5000 mg | ORAL_SOLUTION | ORAL | Status: DC | PRN

## 2014-02-25 MED ORDER — ASPIRIN EC 325 MG PO TBEC
325.0000 mg | DELAYED_RELEASE_TABLET | Freq: Two times a day (BID) | ORAL | Status: DC
  Administered 2014-02-25 – 2014-02-26 (×2): 325 mg via ORAL
  Filled 2014-02-25 (×4): qty 1

## 2014-02-25 MED ORDER — ALUMINUM HYDROXIDE GEL 320 MG/5ML PO SUSP
15.0000 mL | ORAL | Status: DC | PRN
  Filled 2014-02-25: qty 30

## 2014-02-25 MED ORDER — EPHEDRINE SULFATE 50 MG/ML IJ SOLN
INTRAMUSCULAR | Status: DC | PRN
  Administered 2014-02-25: 10 mg via INTRAVENOUS

## 2014-02-25 MED ORDER — PHENYLEPHRINE 40 MCG/ML (10ML) SYRINGE FOR IV PUSH (FOR BLOOD PRESSURE SUPPORT)
PREFILLED_SYRINGE | INTRAVENOUS | Status: AC
  Filled 2014-02-25: qty 10

## 2014-02-25 MED ORDER — NEOSTIGMINE METHYLSULFATE 10 MG/10ML IV SOLN
INTRAVENOUS | Status: DC | PRN
  Administered 2014-02-25: 3 mg via INTRAVENOUS

## 2014-02-25 MED ORDER — CEFAZOLIN SODIUM-DEXTROSE 2-3 GM-% IV SOLR
INTRAVENOUS | Status: DC | PRN
  Administered 2014-02-25: 2 g via INTRAVENOUS

## 2014-02-25 MED ORDER — FENTANYL CITRATE 0.05 MG/ML IJ SOLN
INTRAMUSCULAR | Status: AC
  Filled 2014-02-25: qty 5

## 2014-02-25 MED ORDER — LIDOCAINE HCL (CARDIAC) 20 MG/ML IV SOLN
INTRAVENOUS | Status: AC
  Filled 2014-02-25: qty 5

## 2014-02-25 MED ORDER — FENTANYL CITRATE 0.05 MG/ML IJ SOLN
INTRAMUSCULAR | Status: AC
  Administered 2014-02-25: 100 ug
  Filled 2014-02-25: qty 2

## 2014-02-25 MED ORDER — MENTHOL 3 MG MT LOZG: 1.0000 | LOZENGE | OROMUCOSAL | Status: DC | PRN

## 2014-02-25 MED ORDER — ONDANSETRON HCL 4 MG/2ML IJ SOLN
INTRAMUSCULAR | Status: DC | PRN
  Administered 2014-02-25: 4 mg via INTRAVENOUS

## 2014-02-25 MED ORDER — METOCLOPRAMIDE HCL 10 MG PO TABS: 5.0000 mg | ORAL_TABLET | Freq: Three times a day (TID) | ORAL | Status: DC | PRN

## 2014-02-25 MED ORDER — ONDANSETRON HCL 4 MG/2ML IJ SOLN
INTRAMUSCULAR | Status: AC
  Filled 2014-02-25: qty 2

## 2014-02-25 MED ORDER — ROCURONIUM BROMIDE 100 MG/10ML IV SOLN
INTRAVENOUS | Status: DC | PRN
  Administered 2014-02-25: 50 mg via INTRAVENOUS

## 2014-02-25 MED ORDER — CEFAZOLIN SODIUM-DEXTROSE 2-3 GM-% IV SOLR
2.0000 g | Freq: Four times a day (QID) | INTRAVENOUS | Status: AC
  Administered 2014-02-25 – 2014-02-26 (×3): 2 g via INTRAVENOUS
  Filled 2014-02-25 (×3): qty 50

## 2014-02-25 MED ORDER — EPHEDRINE SULFATE 50 MG/ML IJ SOLN
INTRAMUSCULAR | Status: AC
  Filled 2014-02-25: qty 1

## 2014-02-25 MED ORDER — GLYCOPYRROLATE 0.2 MG/ML IJ SOLN
INTRAMUSCULAR | Status: AC
  Filled 2014-02-25: qty 2

## 2014-02-25 MED ORDER — NEOSTIGMINE METHYLSULFATE 10 MG/10ML IV SOLN
INTRAVENOUS | Status: AC
  Filled 2014-02-25: qty 1

## 2014-02-25 MED ORDER — PROPOFOL 10 MG/ML IV BOLUS
INTRAVENOUS | Status: DC | PRN
  Administered 2014-02-25: 150 mg via INTRAVENOUS
  Administered 2014-02-25: 50 mg via INTRAVENOUS

## 2014-02-25 MED ORDER — LACTATED RINGERS IV SOLN
INTRAVENOUS | Status: DC
  Administered 2014-02-25: 17:00:00 via INTRAVENOUS

## 2014-02-25 MED ORDER — DEXAMETHASONE SODIUM PHOSPHATE 4 MG/ML IJ SOLN
INTRAMUSCULAR | Status: AC
  Filled 2014-02-25: qty 1

## 2014-02-25 MED ORDER — PHENYLEPHRINE HCL 10 MG/ML IJ SOLN
INTRAMUSCULAR | Status: DC | PRN
  Administered 2014-02-25 (×5): 80 ug via INTRAVENOUS

## 2014-02-25 MED ORDER — GLYCOPYRROLATE 0.2 MG/ML IJ SOLN
INTRAMUSCULAR | Status: DC | PRN
Start: 2014-02-25 — End: 2014-02-25
  Administered 2014-02-25: 0.4 mg via INTRAVENOUS

## 2014-02-25 MED ORDER — ONDANSETRON HCL 4 MG/2ML IJ SOLN: 4.0000 mg | Freq: Once | INTRAMUSCULAR | Status: DC | PRN

## 2014-02-25 MED ORDER — DEXAMETHASONE SODIUM PHOSPHATE 4 MG/ML IJ SOLN
INTRAMUSCULAR | Status: DC | PRN
  Administered 2014-02-25: 4 mg via INTRAVENOUS

## 2014-02-25 MED ORDER — CEFAZOLIN SODIUM-DEXTROSE 2-3 GM-% IV SOLR
INTRAVENOUS | Status: AC
  Filled 2014-02-25: qty 50

## 2014-02-25 MED ORDER — ONDANSETRON HCL 4 MG/2ML IJ SOLN: 4.0000 mg | Freq: Four times a day (QID) | INTRAMUSCULAR | Status: DC | PRN

## 2014-02-25 SURGICAL SUPPLY — 90 items
BANDAGE ELASTIC 3 VELCRO ST LF (GAUZE/BANDAGES/DRESSINGS) ×3 IMPLANT
BANDAGE ELASTIC 4 VELCRO ST LF (GAUZE/BANDAGES/DRESSINGS) ×3 IMPLANT
BIT DRILL 4 LONG FAST STEP (BIT) ×3 IMPLANT
BIT DRILL 4 SHORT FAST STEP (BIT) ×3 IMPLANT
BLADE LONG MED 31X9 (MISCELLANEOUS) ×3 IMPLANT
BLADE SURG ROTATE 9660 (MISCELLANEOUS) IMPLANT
BNDG COHESIVE 4X5 TAN STRL (GAUZE/BANDAGES/DRESSINGS) ×3 IMPLANT
BNDG GAUZE ELAST 4 BULKY (GAUZE/BANDAGES/DRESSINGS) ×3 IMPLANT
CHLORAPREP W/TINT 26ML (MISCELLANEOUS) ×3 IMPLANT
COVER SURGICAL LIGHT HANDLE (MISCELLANEOUS) ×6 IMPLANT
CUFF TOURNIQUET SINGLE 18IN (TOURNIQUET CUFF) ×3 IMPLANT
DRAPE C-ARM 42X72 X-RAY (DRAPES) ×3 IMPLANT
DRAPE IMP U-DRAPE 54X76 (DRAPES) ×3 IMPLANT
DRAPE INCISE IOBAN 66X45 STRL (DRAPES) ×3 IMPLANT
DRAPE OEC MINIVIEW 54X84 (DRAPES) IMPLANT
DRAPE PROXIMA HALF (DRAPES) ×3 IMPLANT
DRAPE SURG 17X23 STRL (DRAPES) ×3 IMPLANT
DRAPE U-SHAPE 47X51 STRL (DRAPES) ×3 IMPLANT
DRILL BIT 2.8MM DB28 (MISCELLANEOUS) ×3 IMPLANT
DRSG AQUACEL AG ADV 3.5X10 (GAUZE/BANDAGES/DRESSINGS) ×3 IMPLANT
DRSG EMULSION OIL 3X3 NADH (GAUZE/BANDAGES/DRESSINGS) ×3 IMPLANT
DRSG MEPILEX BORDER 4X8 (GAUZE/BANDAGES/DRESSINGS) ×3 IMPLANT
DRSG PAD ABDOMINAL 8X10 ST (GAUZE/BANDAGES/DRESSINGS) ×3 IMPLANT
ELECT REM PT RETURN 9FT ADLT (ELECTROSURGICAL) ×3
ELECTRODE REM PT RTRN 9FT ADLT (ELECTROSURGICAL) ×2 IMPLANT
GAUZE SPONGE 4X4 12PLY STRL (GAUZE/BANDAGES/DRESSINGS) ×3 IMPLANT
GAUZE XEROFORM 5X9 LF (GAUZE/BANDAGES/DRESSINGS) ×3 IMPLANT
GLOVE BIO SURGEON STRL SZ7 (GLOVE) ×3 IMPLANT
GLOVE BIO SURGEON STRL SZ7.5 (GLOVE) ×3 IMPLANT
GLOVE BIOGEL PI IND STRL 7.0 (GLOVE) ×2 IMPLANT
GLOVE BIOGEL PI IND STRL 8 (GLOVE) ×2 IMPLANT
GLOVE BIOGEL PI INDICATOR 7.0 (GLOVE) ×1
GLOVE BIOGEL PI INDICATOR 8 (GLOVE) ×1
GOWN STRL REUS W/ TWL LRG LVL3 (GOWN DISPOSABLE) ×8 IMPLANT
GOWN STRL REUS W/ TWL XL LVL3 (GOWN DISPOSABLE) ×2 IMPLANT
GOWN STRL REUS W/TWL LRG LVL3 (GOWN DISPOSABLE) ×4
GOWN STRL REUS W/TWL XL LVL3 (GOWN DISPOSABLE) ×1
HEAD EXPLOR 12X24MM (Orthopedic Implant) ×3 IMPLANT
KIT BASIN OR (CUSTOM PROCEDURE TRAY) ×3 IMPLANT
KIT ROOM TURNOVER OR (KITS) ×3 IMPLANT
MANIFOLD NEPTUNE II (INSTRUMENTS) ×3 IMPLANT
NDL SUT 2 .5 CRC MAYO 1.732X (NEEDLE) ×2 IMPLANT
NDL SUT 6 .5 CRC .975X.05 MAYO (NEEDLE) ×2 IMPLANT
NEEDLE 22X1 1/2 (OR ONLY) (NEEDLE) IMPLANT
NEEDLE KEITH (NEEDLE) ×3 IMPLANT
NEEDLE MAYO TAPER (NEEDLE) ×2
NS IRRIG 1000ML POUR BTL (IV SOLUTION) ×3 IMPLANT
PACK ORTHO EXTREMITY (CUSTOM PROCEDURE TRAY) ×3 IMPLANT
PACK SHOULDER (CUSTOM PROCEDURE TRAY) ×3 IMPLANT
PACK UNIVERSAL I (CUSTOM PROCEDURE TRAY) ×3 IMPLANT
PAD ARMBOARD 7.5X6 YLW CONV (MISCELLANEOUS) ×6 IMPLANT
PAD CAST 4YDX4 CTTN HI CHSV (CAST SUPPLIES) ×2 IMPLANT
PADDING CAST COTTON 4X4 STRL (CAST SUPPLIES) ×1
PEG STND 4.0X40MM (Orthopedic Implant) ×3 IMPLANT
PEG STND 4.0X42.5MM (Orthopedic Implant) ×3 IMPLANT
PEG STND 4.0X45.0MM (Orthopedic Implant) ×3 IMPLANT
PEG STND 4.0X52.5MM (Orthopedic Implant) ×6 IMPLANT
PEGSTD 4.0X40MM (Orthopedic Implant) ×2 IMPLANT
PEGSTD 4.0X42.5MM (Orthopedic Implant) ×2 IMPLANT
PEGSTD 4.0X45.0MM (Orthopedic Implant) ×2 IMPLANT
PLATE SHOULDER S3 3HOLE RT (Plate) ×3 IMPLANT
RETRIEVER SUT HEWSON (MISCELLANEOUS) IMPLANT
SCREW LOCK 90D ANGLED 3.8X32 (Screw) ×3 IMPLANT
SCREW MULTIDIR 3.8X34 HUMRL (Screw) ×3 IMPLANT
SLING ARM FOAM STRAP LRG (SOFTGOODS) ×3 IMPLANT
SPONGE LAP 18X18 X RAY DECT (DISPOSABLE) ×6 IMPLANT
SPONGE LAP 4X18 X RAY DECT (DISPOSABLE) ×3 IMPLANT
STAPLER VISISTAT 35W (STAPLE) ×6 IMPLANT
STEM W/SCREW 9X38 (Stem) ×3 IMPLANT
STOCKINETTE IMPERVIOUS 9X36 MD (GAUZE/BANDAGES/DRESSINGS) ×3 IMPLANT
STRIP CLOSURE SKIN 1/2X4 (GAUZE/BANDAGES/DRESSINGS) ×6 IMPLANT
SUCTION FRAZIER TIP 10 FR DISP (SUCTIONS) ×3 IMPLANT
SUPPORT WRAP ARM LG (MISCELLANEOUS) ×3 IMPLANT
SUT BONE WAX W31G (SUTURE) IMPLANT
SUT ETHIBOND NAB CT1 #1 30IN (SUTURE) ×6 IMPLANT
SUT FIBERWIRE #2 38 T-5 BLUE (SUTURE) ×15
SUT MNCRL AB 4-0 PS2 18 (SUTURE) ×3 IMPLANT
SUT VIC AB 0 CT1 27 (SUTURE) ×2
SUT VIC AB 0 CT1 27XBRD ANBCTR (SUTURE) ×4 IMPLANT
SUT VIC AB 2-0 CT1 27 (SUTURE) ×2
SUT VIC AB 2-0 CT1 TAPERPNT 27 (SUTURE) ×4 IMPLANT
SUT VIC AB 2-0 FS1 27 (SUTURE) ×3 IMPLANT
SUT VICRYL 4-0 PS2 18IN ABS (SUTURE) ×3 IMPLANT
SUTURE FIBERWR #2 38 T-5 BLUE (SUTURE) ×10 IMPLANT
SYR CONTROL 10ML LL (SYRINGE) IMPLANT
TOWEL OR 17X24 6PK STRL BLUE (TOWEL DISPOSABLE) ×3 IMPLANT
TOWEL OR 17X26 10 PK STRL BLUE (TOWEL DISPOSABLE) ×3 IMPLANT
TUBE CONNECTING 12X1/4 (SUCTIONS) ×3 IMPLANT
WATER STERILE IRR 1000ML POUR (IV SOLUTION) ×3 IMPLANT
YANKAUER SUCT BULB TIP NO VENT (SUCTIONS) ×3 IMPLANT

## 2014-02-25 NOTE — Progress Notes (Signed)
Report called to short stay nurse reciving pt. Pre-op.

## 2014-02-25 NOTE — Progress Notes (Addendum)
Patient ID: Vincent [name], male   DOB: 27-Jun-1960, 53 y.o.   MRN: [id number]    Subjective: Good pain control, going to OR today with Dr. Tamera Punt  Objective: Vital signs in last 24 hours: Temp:  [98.2 F (36.8 C)-99.8 F (37.7 C)] 98.2 F (36.8 C) (12/17 0813) Pulse Rate:  [70-102] 76 (12/17 0800) Resp:  [11-21] 12 (12/17 0800) BP: (125-150)/(65-97) 141/84 mmHg (12/17 0800) SpO2:  [93 %-100 %] 99 % (12/17 0800) Weight:  [186 lb 15.2 oz (84.8 kg)] 186 lb 15.2 oz (84.8 kg) (12/17 0500) Last BM Date: 02/23/14  Intake/Output from previous day: 12/16 0701 - 12/17 0700 In: 1990 [P.O.:940; I.V.:1050] Out: 2100 [Urine:2100] Intake/Output this shift: Total I/O In: 50 [I.V.:50] Out: 350 [Urine:350]  General appearance: cooperative Resp: clear to auscultation bilaterally Cardio: regular rate and rhythm GI: soft, NT Neuro: Moves legs well, sens intact R fingers Ext: REU splint  Lab Results: CBC   Recent Labs  02/23/14 1337 02/24/14 0242  WBC 16.4* 9.7  HGB 13.7 12.0*  HCT 39.7 35.7*  PLT 207 204   BMET  Recent Labs  02/23/14 1337 02/24/14 0242  NA 139 138  K 3.6* 4.2  CL 101 101  CO2 22 25  GLUCOSE 136* 145*  BUN 16 18  CREATININE 1.17 1.07  CALCIUM 9.2 8.6    Anti-infectives: Anti-infectives    Start     Dose/Rate Route Frequency Ordered Stop   02/24/14 1145  ceFAZolin (ANCEF) IVPB 2 g/50 mL premix     2 g100 mL/hr over 30 Minutes Intravenous On call to O.R. 02/24/14 1135 02/25/14 0559      Assessment/Plan: Fall from ladder L1 burst FX - TLSO per Dr. Hal Neer, PT/OT R prox humerus and radial head FXs - ORIF today by Dr. Tamera Punt B sup and inf rami FXs - WBAT FEN - NPO for OR VTE - PAS, plan Lovenox tomorrow DIspo - to floor  LOS: 2 days    Georganna Skeans, MD, MPH, FACS Trauma: [phone] General Surgery: [phone]  02/25/2014

## 2014-02-25 NOTE — Evaluation (Signed)
Physical Therapy Evaluation Patient Details Name: Vincent [name] MRN: [id number] DOB: June 02, 1960 Today's Date: 02/25/2014   History of Present Illness  53 yo s/p fall from ladder with L1 burst fx, R proximal humerus and radial head fx pending ORIF from dr Tamera Punt, bil fami fx (WBAT). Pt to wear TLSO and don in supine  PMH: skin CA  Clinical Impression  Pt admitted with above. Pt currently with functional limitations due to the deficits listed below (see PT Problem List). Educated on back precautions, safety with mobility, and use of TLSO. Required min assist for all mobility assessed. Reports he will have 24 hour care from his wife at home as needed. Pt will benefit from skilled PT to increase their independence and safety with mobility to allow discharge to the venue listed below.       Follow Up Recommendations Home health PT;Supervision for mobility/OOB    Equipment Recommendations   (TBD - may have a cane at home)    Recommendations for Other Services OT consult     Precautions / Restrictions Precautions Precautions: Back Precaution Comments: Reviewed back precautions and use of TLSO Required Braces or Orthoses: Spinal Brace;Sling Spinal Brace: Thoracolumbosacral orthotic;Applied in supine position Restrictions Weight Bearing Restrictions: Yes Other Position/Activity Restrictions: WBAT      Mobility  Bed Mobility Overal bed mobility: Needs Assistance Bed Mobility: Rolling;Sidelying to Sit Rolling: Min assist Sidelying to sit: Min assist       General bed mobility comments: Min assist with education for log roll technique while donning TLSO. Practiced rolling to Lt and Rt side. Min assist for truncal support into seated positon from sidelying.  Transfers Overall transfer level: Needs assistance Equipment used: 1 person hand held assist Transfers: Sit to/from Stand Sit to Stand: Min assist;From elevated surface         General transfer comment: Min assist for  balance with hand held assist from slightly elevated bed surface. Cues for precautions.  Ambulation/Gait Ambulation/Gait assistance: Min assist Ambulation Distance (Feet): 20 Feet Assistive device: 1 person hand held assist Gait Pattern/deviations: Step-through pattern;Decreased stride length;Drifts right/left;Narrow base of support;Antalgic Gait velocity: decreased Gait velocity interpretation: Below normal speed for age/gender General Gait Details: Min assist for balance with hand held assist. Narrow base of support with antalgic pattern and narrow base of support. Some drift noted. Very guarded due to pain.  Stairs            Wheelchair Mobility    Modified Rankin (Stroke Patients Only)       Balance Overall balance assessment: Needs assistance Sitting-balance support: Feet supported;No upper extremity supported Sitting balance-Leahy Scale: Good     Standing balance support: Single extremity supported;No upper extremity supported Standing balance-Leahy Scale: Fair                               Pertinent Vitals/Pain Pain Assessment: 0-10 Pain Score: 4  Pain Location: Rt shoulder and arm Pain Descriptors / Indicators: Aching Pain Intervention(s): Limited activity within patient's tolerance;Monitored during session;Repositioned  VSS. In Vitals section.    Home Living Family/patient expects to be discharged to:: Private residence Living Arrangements: Spouse/significant other Available Help at Discharge: Family;Available 24 hours/day Type of Home: House Home Access: Stairs to enter Entrance Stairs-Rails: None Entrance Stairs-Number of Steps: 2 Home Layout: Two level;Able to live on main level with bedroom/bathroom Home Equipment: None      Prior Function Level of Independence: Independent  Hand Dominance   Dominant Hand: Right    Extremity/Trunk Assessment   Upper Extremity Assessment: Defer to OT evaluation            Lower Extremity Assessment: Overall WFL for tasks assessed         Communication   Communication: No difficulties  Cognition Arousal/Alertness: Awake/alert Behavior During Therapy: WFL for tasks assessed/performed Overall Cognitive Status: Within Functional Limits for tasks assessed                      General Comments General comments (skin integrity, edema, etc.): Discussed back precautions, safety with mobility, equipment use for home environment and educated on TLSO use.    Exercises        Assessment/Plan    PT Assessment Patient needs continued PT services  PT Diagnosis Difficulty walking;Abnormality of gait;Acute pain   PT Problem List Decreased strength;Decreased range of motion;Decreased activity tolerance;Decreased balance;Decreased mobility;Decreased knowledge of use of DME;Decreased knowledge of precautions;Pain  PT Treatment Interventions DME instruction;Gait training;Stair training;Functional mobility training;Therapeutic activities;Therapeutic exercise;Balance training;Neuromuscular re-education;Patient/family education;Modalities   PT Goals (Current goals can be found in the Care Plan section) Acute Rehab PT Goals Patient Stated Goal: No pain PT Goal Formulation: With patient Time For Goal Achievement: 03/11/14 Potential to Achieve Goals: Good    Frequency Min 5X/week   Barriers to discharge        Co-evaluation               End of Session Equipment Utilized During Treatment: Gait belt Activity Tolerance: Patient tolerated treatment well Patient left: in chair;with call bell/phone within reach;with family/visitor present Nurse Communication: Mobility status         Time: 5732-2025 PT Time Calculation (min) (ACUTE ONLY): 40 min   Charges:   PT Evaluation $Initial PT Evaluation Tier I: 1 Procedure PT Treatments $Therapeutic Activity: 8-22 mins $Self Care/Home Management: 8-22   PT G Codes:          Ellouise Newer 02/25/2014, 10:20 AM  Elayne Snare, [place]

## 2014-02-25 NOTE — Transfer of Care (Signed)
Immediate Anesthesia Transfer of Care Note  Patient: Vincent [name]  Procedure(s) Performed: Procedure(s): OPEN REDUCTION INTERNAL FIXATION (ORIF) PROXIMAL HUMERUS FRACTURE (Right) RADIAL HEAD ARTHROPLASTY (Right)  Patient Location: PACU  Anesthesia Type:GA combined with regional for post-op pain  Level of Consciousness: oriented, patient cooperative and responds to stimulation  Airway & Oxygen Therapy: Patient Spontanous Breathing and Patient connected to nasal cannula oxygen  Post-op Assessment: Report given to PACU RN, Post -op Vital signs reviewed and stable and Patient moving all extremities X 4  Post vital signs: Reviewed and stable  Complications: No apparent anesthesia complications

## 2014-02-25 NOTE — Interval H&P Note (Signed)
History and Physical Interval Note:  02/25/2014 6:02 PM  Vincent [name]  has presented today for surgery, with the diagnosis of Humerous Fracture, Radial head malfomity  The various methods of treatment have been discussed with the patient and family. After consideration of risks, benefits and other options for treatment, the patient has consented to  Procedure(s): OPEN REDUCTION INTERNAL FIXATION (ORIF) PROXIMAL HUMERUS FRACTURE (Right) RADIAL HEAD ARTHROPLASTY (Right) as a surgical intervention .  The patient's history has been reviewed, patient examined, no change in status, stable for surgery.  I have reviewed the patient's chart and labs.  Questions were answered to the patient's satisfaction.     [name]

## 2014-02-25 NOTE — Anesthesia Postprocedure Evaluation (Signed)
  Anesthesia Post-op Note  Patient: Vincent [name]  Procedure(s) Performed: Procedure(s): OPEN REDUCTION INTERNAL FIXATION (ORIF) PROXIMAL HUMERUS FRACTURE (Right) RADIAL HEAD ARTHROPLASTY (Right)  Patient Location: PACU  Anesthesia Type:General and GA combined with regional for post-op pain  Level of Consciousness: awake, alert  and oriented  Airway and Oxygen Therapy: Patient Spontanous Breathing  Post-op Pain: none  Post-op Assessment: Post-op Vital signs reviewed, Patient's Cardiovascular Status Stable, Respiratory Function Stable, Patent Airway, No signs of Nausea or vomiting and Pain level controlled  Post-op Vital Signs: stable  Last Vitals:  Filed Vitals:   02/25/14 2135  BP: 116/78  Pulse: 85  Temp: 36.8 C  Resp: 25    Complications: No apparent anesthesia complications

## 2014-02-25 NOTE — Anesthesia Procedure Notes (Addendum)
Anesthesia Regional Block:  Interscalene brachial plexus block  Pre-Anesthetic Checklist: ,, timeout performed, Correct Patient, Correct Site, Correct Laterality, Correct Procedure, Correct Position, site marked, Risks and benefits discussed,  Surgical consent,  Pre-op evaluation,  At surgeon's request and post-op pain management  Laterality: Right  Prep: Maximum Sterile Barrier Precautions used, chloraprep and alcohol swabs       Needles:  Injection technique: Single-shot  Needle Type: Stimulator Needle - 40        Needle insertion depth: 4 cm   Additional Needles:  Procedures: nerve stimulator Interscalene brachial plexus block  Nerve Stimulator or Paresthesia:  Response: 0.5 mA, 0.1 ms, 4 cm  Additional Responses:   Narrative:  Start time: 02/25/2014 5:15 PM End time: 02/25/2014 5:20 PM Injection made incrementally with aspirations every 5 mL.  Performed by: Personally   Additional Notes: Pt accepts procedure w/ risks. 20cc 0.5% Marcaine w/ epi w/o difficulty or discomfort. GES   Procedure Name: Intubation Date/Time: 02/25/2014 6:54 PM Performed by: Claris Che Pre-anesthesia Checklist: Patient identified, Emergency Drugs available, Suction available, Patient being monitored and Timeout performed Patient Re-evaluated:Patient Re-evaluated prior to inductionOxygen Delivery Method: Circle system utilized Preoxygenation: Pre-oxygenation with 100% oxygen Intubation Type: IV induction Ventilation: Mask ventilation without difficulty Laryngoscope Size: Mac and 4 Grade View: Grade II Tube type: Oral Tube size: 7.5 mm Number of attempts: 1 Airway Equipment and Method: Stylet Placement Confirmation: ETT inserted through vocal cords under direct vision,  positive ETCO2 and breath sounds checked- equal and bilateral Secured at: 23 cm Tube secured with: Tape Dental Injury: Teeth and Oropharynx as per pre-operative assessment     Anesthesia Regional Block:   Interscalene brachial plexus block  Pre-Anesthetic Checklist: ,, timeout performed, Correct Patient, Correct Site, Correct Laterality, Correct Procedure, Correct Position, site marked, Risks and benefits discussed,  Surgical consent,  Pre-op evaluation,  At surgeon's request and post-op pain management  Laterality: Left  Prep: chloraprep       Needles:  Injection technique: Single-shot  Needle Type: Echogenic Stimulator Needle     Needle Length: 9cm 9 cm Needle Gauge: 22 and 22 G    Additional Needles:  Procedures: ultrasound guided (picture in chart) Interscalene brachial plexus block Narrative:  Start time: 02/25/2014 9:45 PM End time: 02/25/2014 11:50 PM Injection made incrementally with aspirations every 5 mL.  Performed by: Personally   Additional Notes: 20 cc 0.5% Marcaine with 1:200 Epi injected easily

## 2014-02-25 NOTE — Anesthesia Preprocedure Evaluation (Signed)
Anesthesia Evaluation  Patient identified by MRN, date of birth, ID band Patient awake    Reviewed: Allergy & Precautions, H&P , NPO status , Patient's Chart, lab work & pertinent test results  Airway        Dental   Pulmonary          Cardiovascular     Neuro/Psych    GI/Hepatic   Endo/Other    Renal/GU      Musculoskeletal   Abdominal   Peds  Hematology   Anesthesia Other Findings   Reproductive/Obstetrics                             Anesthesia Physical Anesthesia Plan  ASA: I  Anesthesia Plan: General   Post-op Pain Management:    Induction: Intravenous  Airway Management Planned: Oral ETT  Additional Equipment:   Intra-op Plan:   Post-operative Plan: Extubation in OR  Informed Consent: I have reviewed the patients History and Physical, chart, labs and discussed the procedure including the risks, benefits and alternatives for the proposed anesthesia with the patient or authorized representative who has indicated his/her understanding and acceptance.     Plan Discussed with: CRNA, Anesthesiologist and Surgeon  Anesthesia Plan Comments:         Anesthesia Quick Evaluation

## 2014-02-25 NOTE — Op Note (Signed)
Procedure(s): OPEN REDUCTION INTERNAL FIXATION (ORIF) PROXIMAL HUMERUS FRACTURE RADIAL HEAD ARTHROPLASTY Procedure Note  Vincent [name] male 53 y.o. 02/25/2014  Procedure(s) and Anesthesia Type:    #1 OPEN REDUCTION INTERNAL FIXATION (ORIF) right PROXIMAL HUMERUS FRACTURE - General    #2 RADIAL HEAD ARTHROPLASTY right elbow - General    #3 nonoperative management right-sided superior and inferior pubic ramus fractures  Surgeon(s) and Role:    * [name], MD - Primary   Indications:  53 y.o. male s/p fall from a ladder 15 feet with right displaced proximal humerus fracture and comminuted radial head fracture. Indicated for surgery to restore anatomic alignment, promote maximal functional outcome.  Surgeon: [name]   Assistants: Jeanmarie Hubert PA-C [facility] was present and scrubbed throughout the procedure and was essential in positioning, retraction, exposure, and closure)  Anesthesia: General endotracheal anesthesia with preoperative interscalene block given by the attending anesthesiologist    Procedure Detail  Findings: Anatomic reduction of the displaced 2 part proximal humerus fracture with a Biomet S3 plate. Radial head replaced with a Biomet radial head implant. Elbow ligamentously stable.  Estimated Blood Loss:  200 mL         Drains: none  Blood Given: none         Specimens: none        Complications:  * No complications entered in OR log *         Disposition: PACU - hemodynamically stable.         Condition: stable    Procedure:  DESCRIPTION OF PROCEDURE: The patient was identified in preoperative  holding area where I personally marked the operative site after  verifying site, side, and procedure with the patient. The patient was taken back  to the operating room where general anesthesia was induced without  complication and was placed in the beach-chair position with the back  elevated about 40 degrees and all  extremities carefully padded and  positioned. The right upper extremity was prepped and draped in standard sterile fashion. After the appropriate timeout procedure a approximately 12 cm incision was made from the tip of the coracoid to the center point of the humerus just below the axilla. Dissection was carried down through subcutaneous tissues and cephalic vein was taken laterally with the deltoid. A retractor was placed beneath the conjoined tendon medially and the deltoid laterally. The fracture was identified and reduced with forward flexion. Once reduced it was held in anatomic position while a Biomet S3 plate was laid just lateral to the bicipital groove. The biceps tendon was identified and was intact. It was left intact throughout the procedure. Once the plate position was verified with fluoroscopic imaging a distal shaft screw was placed. Proximally 5 locking smooth pegs were then placed taking care not to penetrate the articular surface. One additional distal shaft screw was placed. Final fluoroscopic imaging demonstrated anatomic reduction of the fracture and appropriate positioning of hardware. Copious irrigation was used. The wound was then closed with 2-0 Vicryl and staples. Attention was then turned to the elbow where a approximately 6 cm incision was made obliquely over the palpable radial head. Dissection was carried down the fascia which was noted to be intact. The interval between the ECRL and EDC was identified and split sharply at the level of the epicondyles and carried distally to the annular ligament. The hand was held in pronation. The joint was entered and dissection stayed just anterior to the level of the center point the capitellum.  Posteriorly the collateral ligament was noted to be intact. 3 large radial head fragments were removed. Several small fragments were then removed. The joint was irrigated. A saw was used to freshen the cut. The radial head was sized on the back table and  was the largest available. Intramedullary canal was then reamed to the appropriate size. The trial was then placed. Radiographic imaging showed appropriate sizing with no overstuffing. Range of motion was full. Final implant was then placed with the stent being placed first in the radial head inserted from the side and secured with a setscrew. Full range of motion was achieved. No instability was noted. Final fluoroscopic imaging in AP and lateral planes showed appropriate sizing. The joint was copiously irrigated with normal stance is chronic closed with 0 Vicryl in a deep layer and the skin was closed with 2-0 Vicryl and staples. A light sterile dressing was applied. The patient was then placed in a sling, allowed to awaken from anesthesia transferred to stretcher and taken to recovery room in stable condition.  Postoperative plan: He'll be readmitted to the hospital. He will be observed overnight and likely could be discharged tomorrow if he does well with physical therapy. From a standpoint of his pubic ramus fractures he can be weightbearing as tolerated. His L1 burst fracture is being managed by neurosurgery and he will be in a brace when ambulating.

## 2014-02-26 ENCOUNTER — Encounter (HOSPITAL_COMMUNITY): Payer: Self-pay | Admitting: Orthopedic Surgery

## 2014-02-26 DIAGNOSIS — D62 Acute posthemorrhagic anemia: Secondary | ICD-10-CM | POA: Diagnosis not present

## 2014-02-26 DIAGNOSIS — S32599A Other specified fracture of unspecified pubis, initial encounter for closed fracture: Secondary | ICD-10-CM | POA: Diagnosis present

## 2014-02-26 DIAGNOSIS — S52121A Displaced fracture of head of right radius, initial encounter for closed fracture: Secondary | ICD-10-CM | POA: Diagnosis present

## 2014-02-26 LAB — BASIC METABOLIC PANEL
Anion gap: 11 (ref 5–15)
BUN: 11 mg/dL (ref 6–23)
CHLORIDE: 102 meq/L (ref 96–112)
CO2: 26 mEq/L (ref 19–32)
Calcium: 8.4 mg/dL (ref 8.4–10.5)
Creatinine, Ser: 0.9 mg/dL (ref 0.50–1.35)
GFR calc Af Amer: >90 mL/min (ref >90)
GFR calc non Af Amer: >90 mL/min (ref >90)
GLUCOSE: 126 mg/dL — AB (ref 70–99)
POTASSIUM: 4.6 meq/L (ref 3.7–5.3)
Sodium: 139 mEq/L (ref 137–147)

## 2014-02-26 LAB — CBC
HEMATOCRIT: 32.2 % — AB (ref 39.0–52.0)
HEMOGLOBIN: 11 g/dL — AB (ref 13.0–17.0)
MCH: 31.5 pg (ref 26.0–34.0)
MCHC: 34.2 g/dL (ref 30.0–36.0)
MCV: 92.3 fL (ref 78.0–100.0)
Platelets: 163 10*3/uL (ref 150–400)
RBC: 3.49 MIL/uL — AB (ref 4.22–5.81)
RDW: 13.2 % (ref 11.5–15.5)
WBC: 9.4 10*3/uL (ref 4.0–10.5)

## 2014-02-26 MED ORDER — OXYCODONE HCL 5 MG PO TABS
5.0000 mg | ORAL_TABLET | ORAL | Status: DC | PRN
  Administered 2014-02-26 (×2): 15 mg via ORAL
  Filled 2014-02-26 (×2): qty 3

## 2014-02-26 MED ORDER — ENOXAPARIN SODIUM 40 MG/0.4ML ~~LOC~~ SOLN
40.0000 mg | SUBCUTANEOUS | Status: DC
  Administered 2014-02-26: 40 mg via SUBCUTANEOUS
  Filled 2014-02-26: qty 0.4

## 2014-02-26 MED ORDER — ASPIRIN 325 MG PO TBEC: 325.0000 mg | DELAYED_RELEASE_TABLET | Freq: Two times a day (BID) | ORAL | Status: DC

## 2014-02-26 MED ORDER — OXYCODONE-ACETAMINOPHEN 10-325 MG PO TABS: 1.0000 | ORAL_TABLET | ORAL | Status: DC | PRN

## 2014-02-26 MED ORDER — MORPHINE SULFATE 2 MG/ML IJ SOLN
2.0000 mg | INTRAMUSCULAR | Status: DC | PRN
  Administered 2014-02-26 (×2): 2 mg via INTRAVENOUS
  Filled 2014-02-26 (×2): qty 1

## 2014-02-26 NOTE — Progress Notes (Signed)
Physical Therapy Treatment Patient Details Name: Vincent [name] MRN: [id number] DOB: 04-Jan-1961 Today's Date: 02/26/2014    History of Present Illness 53 y.o. male with fall from ladder resulting in L1 burst fracture, R prox humerus and radial head FXs, and Bil sup and inf rami FXs. Pt is to don TLSO in bed and is WBAT. s/p ORIF humerus, arthroplasty radial head  02/25/14.    PT Comments    Patient with new surgery on right arm since last visit.  Reports much improved without heavy splint that was on the arm.  Feel should do well with HHPT and family assist.  Patient left in room with OT.  Follow Up Recommendations  Home health PT;Supervision/Assistance - 24 hour     Equipment Recommendations  Cane    Recommendations for Other Services       Precautions / Restrictions Precautions Precautions: Back Precaution Comments: Reviewed back precautions and use of TLSO Required Braces or Orthoses: Spinal Brace;Sling Spinal Brace: Thoracolumbosacral orthotic;Applied in supine position Restrictions Weight Bearing Restrictions: Yes RUE Weight Bearing: Non weight bearing Other Position/Activity Restrictions: WBAT BLE    Mobility  Bed Mobility Overal bed mobility: Needs Assistance Bed Mobility: Rolling;Sit to Sidelying Rolling: Min guard Sidelying to sit: Min assist     Sit to sidelying: Min assist General bed mobility comments: cues for technique, able to push up with left arm only, guided trunk upright  Transfers Overall transfer level: Needs assistance Equipment used: Straight cane Transfers: Sit to/from Stand Sit to Stand: Min assist         General transfer comment: kept cane within reach and pt grabbed once upright to help with balance  Ambulation/Gait Ambulation/Gait assistance: Min guard;Supervision Ambulation Distance (Feet): 100 Feet Assistive device: Straight cane Gait Pattern/deviations: Step-to pattern;Decreased stride length;Decreased step length - left      General Gait Details: slow, decreased trunk rotation; education on activity level following back injury   Stairs            Wheelchair Mobility    Modified Rankin (Stroke Patients Only)       Balance     Sitting balance-Leahy Scale: Good       Standing balance-Leahy Scale: Fair                      Cognition Arousal/Alertness: Awake/alert Behavior During Therapy: WFL for tasks assessed/performed Overall Cognitive Status: Within Functional Limits for tasks assessed                      Exercises Other Exercises Other Exercises: R elbow AAROM Other Exercises: wrist/hand AROM - squeeze ball given Other Exercises: sling management    General Comments        Pertinent Vitals/Pain Pain Assessment: 0-10 Pain Score: 6  Pain Location: Right UE Pain Descriptors / Indicators: Aching Pain Intervention(s): Limited activity within patient's tolerance;Monitored during session;RN gave pain meds during session    Home Living Family/patient expects to be discharged to:: Private residence Living Arrangements: Spouse/significant other Available Help at Discharge: Family;Available 24 hours/day Type of Home: House Home Access: Stairs to enter Entrance Stairs-Rails: None Home Layout: Two level;Able to live on main level with bedroom/bathroom Home Equipment: None      Prior Function Level of Independence: Independent      Comments: Works with computers   PT Goals (current goals can now be found in the care plan section) Acute Rehab PT Goals Patient Stated Goal: to be able to take  care of myself Progress towards PT goals: Progressing toward goals    Frequency  Min 5X/week    PT Plan Current plan remains appropriate    Co-evaluation             End of Session Equipment Utilized During Treatment: Gait belt Activity Tolerance: Patient tolerated treatment well Patient left: Other (comment) (standing with OT in room)     Time:  1287-8676 PT Time Calculation (min) (ACUTE ONLY): 48 min  Charges:  $Gait Training: 8-22 mins $Therapeutic Activity: 23-37 mins                    G Codes:      Trashaun Streight,CYNDI 03/03/2014, 11:31 AM Magda Kiel, PT [phone] 2014/03/03

## 2014-02-26 NOTE — Progress Notes (Signed)
Patient ID: Vincent [name], male   DOB: [birth year], 53 y.o.   MRN: [id number]   LOS: 3 days   Subjective: No c/o, pain controlled.   Objective: Vital signs in last 24 hours: Temp:  [97.9 F (36.6 C)-99.7 F (37.6 C)] 99.4 F (37.4 C) (12/18 0447) Pulse Rate:  [75-106] 81 (12/18 0447) Resp:  [16-25] 16 (12/18 0447) BP: (116-148)/(75-93) 122/75 mmHg (12/18 0447) SpO2:  [95 %-100 %] 100 % (12/18 0447) Last BM Date: 02/23/14   Laboratory  CBC  Recent Labs  02/24/14 0242 02/26/14 0508  WBC 9.7 9.4  HGB 12.0* 11.0*  HCT 35.7* 32.2*  PLT 204 163   BMET  Recent Labs  02/24/14 0242 02/26/14 0508  NA 138 139  K 4.2 4.6  CL 101 102  CO2 25 26  GLUCOSE 145* 126*  BUN 18 11  CREATININE 1.07 0.90  CALCIUM 8.6 8.4    Physical Exam General appearance: alert and no distress Resp: clear to auscultation bilaterally Cardio: regular rate and rhythm GI: normal findings: bowel sounds normal and soft, non-tender Extremities: NVI   Assessment/Plan: Fall from ladder L1 burst FX - TLSO per Dr. Hal Neer, PT/OT R prox humerus and radial head FXs s/p ORIF humerus, arthroplasty radial head - NWB B sup and inf rami FXs - WBAT ABL anemia -- Mild FEN - Advance diet VTE - SCD's, start Lovenox  DIspo - PT/OT    Lisette Abu, PA-C Pager: [phone] General Trauma PA Pager: [phone]  02/26/2014

## 2014-02-26 NOTE — Progress Notes (Signed)
Patient ID: Vincent [name], male   DOB: [birth year], 53 y.o.   MRN: [id number] Afeb, vss No new neuro issues Got up today with his brace on and did well from his back stand point. He can be discharged anytime from my stand point and I will need to see him in my office in 2 weeks.

## 2014-02-26 NOTE — Progress Notes (Signed)
Occupational Therapy Treatment Patient Details Name: Vincent [name] MRN: [id number] DOB: September 01, 1960 Today's Date: 02/26/2014    History of present illness 53 y.o. male with fall from ladder resulting in L1 burst fracture, R prox humerus and radial head FXs, and Bil sup and inf rami FXs. Pt is to don TLSO in bed and is WBAT. s/p ORIF humerus, arthroplasty radial head  02/25/14.   OT comments  Completed education with wife regarding management of TLSO, compensatory techniques for ADL, use of DME for ADL and availability and use of AE. Written information given for back precautions and management of RUE. Pt/wife verbalized understanding. All further OT to be addressed by [name].  Follow Up Recommendations  Home health OT;Supervision/Assistance - 24 hour    Equipment Recommendations  3 in 1 bedside comode    Recommendations for Other Services      Precautions / Restrictions Precautions Precautions: Back Precaution Booklet Issued: Yes (comment) Precaution Comments: Reviewed back precautions and use of TLSO Required Braces or Orthoses: Spinal Brace;Sling Spinal Brace: Thoracolumbosacral orthotic;Applied in supine position (OK to remove for bathing/dressing) Restrictions RUE Weight Bearing: Non weight bearing Other Position/Activity Restrictions: WBAT BLE       Mobility Bed Mobility Overal bed mobility: Needs Assistance Bed Mobility: Sidelying to Sit Rolling: Supervision Sidelying to sit: Min assist       General bed mobility comments: Using BLE to help power up to sit EOB  Transfers Overall transfer level: Needs assistance   Transfers: Sit to/from Stand;Stand Pivot Transfers Sit to Stand: Supervision Stand pivot transfers: Supervision            Balance                                   ADL                                       Functional mobility during ADLs: Min guard General ADL Comments: completed education with wife regarding ADL  following back precautions and managing RUE. Shirt donned in supine, followed by TLSO. Sat to EOB to donn pants. discussed home set up and rec to shower at night before going to bed in order to eliminate need to donn/doff TLSO frequently. discussed set up for bathing and need for AE to assist with bathing/dressing, including hygiene after toileting. wife able to help return demonstrate donning of TLSO. Also educated on technique to transfer into shower and go up 2 steps using cane  Pt/wife also education on proper handling techniques for bed mobility      Vision                     Perception     Praxis      Cognition   Behavior During Therapy: [name] for tasks assessed/performed Overall Cognitive Status: Within Functional Limits for tasks assessed                       Extremity/Trunk Assessment               Exercises     Shoulder Instructions       General Comments      Pertinent Vitals/ Pain       Pain Assessment: 0-10 Pain Score: 4  Pain Location: RUE Pain Descriptors / Indicators: Aching  Pain Intervention(s): Limited activity within patient's tolerance;Monitored during session;Repositioned;Ice applied  Home Living                                          Prior Functioning/Environment              Frequency Min 2X/week     Progress Toward Goals  OT Goals(current goals can now be found in the care plan section)  Progress towards OT goals: Progressing toward goals  Acute Rehab OT Goals Patient Stated Goal: to be able to take care of myself OT Goal Formulation: With patient Time For Goal Achievement: 03/12/14 Potential to Achieve Goals: Good ADL Goals Pt Will Perform Upper Body Bathing: with caregiver independent in assisting;with mod assist;with adaptive equipment Pt Will Perform Lower Body Bathing: with mod assist;with caregiver independent in assisting;with adaptive equipment;sit to/from stand Pt Will Perform Upper  Body Dressing: with mod assist;with caregiver independent in assisting Pt Will Perform Lower Body Dressing: with caregiver independent in assisting;with mod assist;with adaptive equipment Pt Will Transfer to Toilet: with min guard assist;bedside commode;ambulating Pt Will Perform Toileting - Clothing Manipulation and hygiene: with min assist;with caregiver independent in assisting;with adaptive equipment Pt/caregiver will Perform Home Exercise Program: Right Upper extremity;With minimal assist  Plan Discharge plan remains appropriate    Co-evaluation                 End of Session Equipment Utilized During Treatment: Back brace;Other (comment) (sling)   Activity Tolerance Patient tolerated treatment well   Patient Left in chair;with call bell/phone within reach;with family/visitor present   Nurse Communication Mobility status;Precautions;Weight bearing status        Time: 1640-1721 OT Time Calculation (min): 41 min  Charges: OT General Charges $OT Visit: 1 Procedure OT Treatments $Self Care/Home Management : 38-52 mins  Vincent [name],HILLARY 02/26/2014, 5:36 PM [facility], OTR/L  [phone] 02/26/2014

## 2014-02-26 NOTE — Progress Notes (Signed)
Received HHPT/OT orders for patient for discharge today. Also cane and 3-in-1.  Referrals for all given to [facility] per pt's choice and insurance network options. Confirmed that address and phone number in EPIC are correct.   Sandi Mariscal, RN BSN MHA CCM  Case Manager, Trauma Service/Unit 38M [phone]

## 2014-02-26 NOTE — Progress Notes (Signed)
Occupational Therapy Evaluation Patient Details Name: Vincent [name] MRN: [id number] DOB: 05-10-1960 Today's Date: 02/26/2014    History of Present Illness 53 y.o. male with fall from ladder resulting in L1 burst fracture, R prox humerus and radial head FXs, and Bil sup and inf rami FXs. Pt is to don TLSO in bed and is WBAT.  S/p ORIF R humerus and radial head arthroplasty 12/17. NWB RUE.    Clinical Impression   PTA, pt independent with all ADL and mobility. Spoke with Dr. Hal Neer over phone and he gave OK for pt to bath/dress without TLSO on in sitting. Began education on compensatory techniques for ADL following back precautions and using 1 handed techniques. Educated family on doffing TLSO in supine. Pt began R elbow AAROM - very painful. Will plan to return after lunch to complete education. Pt will need initial 24/7 HHOT and 3 in 1 to D/C home safely.     Follow Up Recommendations  Home health OT;Supervision/Assistance - 24 hour (initially)    Equipment Recommendations  3 in 1 bedside comode    Recommendations for Other Services       Precautions / Restrictions Precautions Precautions: Back Precaution Comments: Reviewed back precautions and use of TLSO Required Braces or Orthoses: Spinal Brace;Sling Spinal Brace: Thoracolumbosacral orthotic;Applied in supine position Restrictions Weight Bearing Restrictions: Yes RUE Weight Bearing: Non weight bearing Other Position/Activity Restrictions: WBAT BLE      Mobility Bed Mobility Overal bed mobility: Needs Assistance Bed Mobility: Rolling;Sit to Sidelying Rolling: Min assist       Sit to sidelying: Min assist General bed mobility comments: Able to roll for therapist to doff TLSO  Transfers Overall transfer level: Needs assistance Equipment used: 1 person hand held assist   Sit to Stand: Min assist;From elevated surface         General transfer comment: Min assist for balance with hand held assist from slightly  elevated bed surface. Cues for precautions.    Balance     Sitting balance-Leahy Scale: Good       Standing balance-Leahy Scale: Fair                              ADL Overall ADL's : Needs assistance/impaired     Grooming: Set up;Supervision/safety;Sitting   Upper Body Bathing: Moderate assistance;Bed level   Lower Body Bathing: Maximal assistance;Sit to/from stand   Upper Body Dressing : Maximal assistance;Bed level   Lower Body Dressing: Maximal assistance;Sit to/from stand   Toilet Transfer: Minimal assistance;BSC (over toilet)   Toileting- Clothing Manipulation and Hygiene: Total assistance;Sit to/from stand       Functional mobility during ADLs: Minimal assistance;Cueing for sequencing (with [place]) General ADL Comments: Began educating pt/familyon compensatory techniques for ADL. Performed at bed level and sitting due o TLSO. Discussed with Dr. Hal Neer who states pt is allowed to remove TLSO for bathing and dressing. Began education on AE for LB bathing, including toilet aid.      Vision                     Perception     Praxis      Pertinent Vitals/Pain Pain Assessment: 0-10 Pain Score: 6  Pain Location: r arm Pain Descriptors / Indicators: Aching Pain Intervention(s): Limited activity within patient's tolerance;Monitored during session;Repositioned     Hand Dominance Right   Extremity/Trunk Assessment Upper Extremity Assessment Upper Extremity Assessment: RUE deficits/detail RUE Deficits /  Details: R proximal humerus fracture and proximal radius fx. Very painful RUE. Able to extend RUE to @ -30. limited supination/pronatioin. edematous. Able to make full fist.    Lower Extremity Assessment Lower Extremity Assessment: Defer to PT evaluation   Cervical / Trunk Assessment Cervical / Trunk Assessment: Other exceptions (L1 burst fracture)   Communication Communication Communication: No difficulties   Cognition Arousal/Alertness:  Awake/alert Behavior During Therapy: WFL for tasks assessed/performed Overall Cognitive Status: Within Functional Limits for tasks assessed                     General Comments       Exercises Exercises: Other exercises Other Exercises Other Exercises: R elbow AAROM Other Exercises: wrist/hand AROM - squeeze ball given Other Exercises: sling management   Shoulder Instructions      Home Living Family/patient expects to be discharged to:: Private residence Living Arrangements: Spouse/significant other Available Help at Discharge: Family;Available 24 hours/day Type of Home: House Home Access: Stairs to enter [employer] of Steps: 2 Entrance Stairs-Rails: None Home Layout: Two level;Able to live on main level with bedroom/bathroom     Bathroom Shower/Tub: Tub/shower unit Shower/tub characteristics: Door Biochemist, clinical: Standard Bathroom Accessibility: Yes   Home Equipment: None          Prior Functioning/Environment Level of Independence: Independent        Comments: Works with computers    OT Diagnosis: Generalized weakness;Acute pain   OT Problem List: Decreased strength;Decreased range of motion;Decreased activity tolerance;Impaired balance (sitting and/or standing);Decreased knowledge of use of DME or AE;Decreased knowledge of precautions;Impaired UE functional use;Pain;Increased edema   OT Treatment/Interventions: Self-care/ADL training;Therapeutic exercise;DME and/or AE instruction;Therapeutic activities;Patient/family education;Balance training    OT Goals(Current goals can be found in the care plan section) Acute Rehab OT Goals Patient Stated Goal: to be able to take care of myself OT Goal Formulation: With patient Time For Goal Achievement: 03/12/14 Potential to Achieve Goals: Good  OT Frequency: Min 2X/week   Barriers to D/C:            Co-evaluation              End of Session Equipment Utilized During Treatment: Gait  belt;Back brace;Other (comment) ([place]) Nurse Communication: Mobility status;Precautions  Activity Tolerance: Patient tolerated treatment well Patient left: in bed;with call bell/phone within reach;with family/visitor present   Time: 5361-4431 OT Time Calculation (min): 37 min Charges:  OT General Charges $OT Visit: 1 Procedure OT Evaluation $Initial OT Evaluation Tier I: 1 Procedure OT Treatments $Self Care/Home Management : 23-37 mins G-Codes:    Ivar Domangue,HILLARY March 20, 2014, 11:26 AM   Maurie Boettcher, OTR/L  [phone] 03/20/14

## 2014-02-26 NOTE — Discharge Summary (Signed)
Physician Discharge Summary  Patient ID: Vincent [name] MRN: [id number] DOB/AGE: 51-Nov-1962 51 y.o.  Admit date: 02/23/2014 Discharge date: 02/26/2014  Discharge Diagnoses Patient Active Problem List   Diagnosis Date Noted  . Right radial head fracture 02/26/2014  . Fracture of multiple pubic rami 02/26/2014  . Acute blood loss anemia 02/26/2014  . Proximal humerus fracture 02/25/2014  . Fracture of L1 vertebra due to fall of 12 feet 02/23/2014  . Back pain with left-sided sciatica 12/30/2013  . Reactive depression (situational) 01/17/2012  . Routine general medical examination at a health care facility 11/22/2011  . Hearing loss secondary to cerumen impaction 08/16/2011  . Right flank pain 04/26/2011    Consultants Dr. Karie Chimera for neurosurgery  Dr. Tania Ade for orthopedic surgery   Procedures 12/17 -- ORIF of right proximal humerus fracture, radial head arthroplasty of right elbow, and nonoperative management of right superior and inferior pubic rami fractures by Dr. Tamera Punt   HPI: [name] presented to the emergency department after falling approximately 20 feet from the roof while cleaning gutters. He immediately complained of right arm pain and back pain. He was found to have a proximal humerus fracture as well as proximal radius and ulnar fracture. Dr. Tamera Punt from orthopedics has seen the patient and evaluated him. Because of this as back pain, he also underwent CT scans of the spine as well as the abdomen and pelvis. He was found to have a L1 burst fracture with some loss of height and some retropulsion of fragments. He also has right superior and inferior pubic ramus fractures. Neurosurgery was consulted and recommended non-operative treatment in a TLSO brace. He was admitted to the trauma service.   Hospital Course: The patient was taken to surgery the following day for the listed procedure. His pain was controlled on oral medications and he was tolerating a  regular diet. He was mobilized with physical and occupational therapies once his brace was delivered and did extremely well. He was discharged home in good condition in the care of his wife.      Medication List    TAKE these medications        aspirin 325 MG EC tablet  Take 1 tablet (325 mg total) by mouth 2 (two) times daily.     citalopram 20 MG tablet  Commonly known as:  CELEXA  1 tab each bedtime     ONE-A-DAY 50 PLUS PO  Take by mouth daily.     oxyCODONE-acetaminophen 10-325 MG per tablet  Commonly known as:  PERCOCET  Take 1-2 tablets by mouth every 4 (four) hours as needed for pain.             Follow-up Information    Follow up with [name], MD. Schedule an appointment as soon as possible for a visit in 2 weeks.   Specialty:  Orthopedic Surgery   Contact information:   [address] [place] [address] [phone]       Follow up with Faythe Ghee, MD.   Specialty:  Neurosurgery   Contact information:   [address] [place] [address] [phone]       Follow up with [facility].   Why:  As needed   Contact information:   [address] [place] [address] [phone]        Signed: Lisette Abu, PA-C Pager: [phone] General Trauma PA Pager: [phone] 02/26/2014, 4:14 PM

## 2014-02-26 NOTE — Progress Notes (Signed)
   PATIENT ID: Vincent [name]   1 Day Post-Op Procedure(s) (LRB): OPEN REDUCTION INTERNAL FIXATION (ORIF) PROXIMAL HUMERUS FRACTURE (Right) RADIAL HEAD ARTHROPLASTY (Right)  Subjective: Pain after nerve block wore off. Improved with meds this am.  Objective:  Filed Vitals:   02/26/14 0447  BP: 122/75  Pulse: 81  Temp: 99.4 F (37.4 C)  Resp: 16     R shoulder and elbow dressings c/d/i, sling intacts Distally NVID  Labs:   Recent Labs  02/23/14 1337 02/24/14 0242 02/26/14 0508  HGB 13.7 12.0* 11.0*   Recent Labs  02/24/14 0242 02/26/14 0508  WBC 9.7 9.4  RBC 3.99* 3.49*  HCT 35.7* 32.2*  PLT 204 163   Recent Labs  02/24/14 0242 02/26/14 0508  NA 138 139  K 4.2 4.6  CL 101 102  CO2 25 26  BUN 18 11  CREATININE 1.07 0.90  GLUCOSE 145* 126*  CALCIUM 8.6 8.4    Assessment and Plan:Doing well POD1 s/p ORIF R proximal humerus fracture, R radial head replacement Ok for gentle AAROM R elbow Sling at all times except bathing, excercises F/u 10-14 days, ok for d/c from ortho standpoint.

## 2014-02-26 NOTE — Discharge Instructions (Signed)
Discharge Instructions   A sling has been provided for you. Remain in your sling at all times except for elbow exercises. This includes sleeping in your sling.  Use ice on the shoulder intermittently over the first 48 hours after surgery.  Pain medicine has been prescribed for you.  Use your medicine liberally over the first 48 hours, and then you can begin to taper your use. You may take Extra Strength Tylenol or Tylenol only in place of the pain pills. DO NOT take ANY nonsteroidal anti-inflammatory pain medications: Advil, Motrin, Ibuprofen, Aleve, Naproxen or Naprosyn.  Shoulder dressing is waterproof, leave on until your first visit with Dr. Tamera Punt. The elbow dressing must stay dry in the shower and can be removed 3-5 days after surgery. You may shower 5 days after surgery. The incisions CANNOT get wet prior to 5 days. Simply allow the water to wash over the site and then pat dry. Do not rub the incisions. Make sure your axilla (armpit) is completely dry after showering.  Take one aspirin, a day for 2 weeks after surgery, unless you have an aspirin sensitivity/ allergy or asthma.   Please call [phone] during normal business hours or [phone] after hours for any problems. Including the following:  - excessive redness of the incisions - drainage for more than 4 days - fever of more than 101.5 F  *Please note that pain medications will not be refilled after hours or on weekends.  No driving while taking oxycodone.  Keep brace on whenever you are out of bed.

## 2014-03-17 ENCOUNTER — Telehealth: Payer: Self-pay | Admitting: Family Medicine

## 2014-03-17 NOTE — Telephone Encounter (Signed)
Simona Huh calling from [employer] to request a verbal order for a 1 time physical therapy visit.  Pt was seen on 03/10/14 and an order is required for insurance to pay.  Please return call to [facility].

## 2014-03-17 NOTE — Telephone Encounter (Signed)
Left detailed message on machine for Vincent [name] that per Dr Sherren Mocha he can not give a verbal order because he did not order PT.

## 2014-04-13 ENCOUNTER — Other Ambulatory Visit: Payer: Self-pay | Admitting: Neurosurgery

## 2014-04-13 DIAGNOSIS — S32009A Unspecified fracture of unspecified lumbar vertebra, initial encounter for closed fracture: Secondary | ICD-10-CM

## 2014-04-15 ENCOUNTER — Ambulatory Visit
Admission: RE | Admit: 2014-04-15 | Discharge: 2014-04-15 | Disposition: A | Discharge status: Home / Self Care | Payer: BLUE CROSS/BLUE SHIELD | Source: Ambulatory Visit | Attending: Neurosurgery | Admitting: Neurosurgery

## 2014-04-15 DIAGNOSIS — S32009A Unspecified fracture of unspecified lumbar vertebra, initial encounter for closed fracture: Secondary | ICD-10-CM

## 2015-02-09 IMAGING — RF DG ELBOW 2V*R*
1 series · 2 of 2 positions shown · non-contrast
Comparison: None.

CLINICAL DATA: RIGHT radial head arthroplasty.

EXAM:
RIGHT ELBOW - 2 VIEW

[Series 1: run · 2 of 2 slices shown]
[im 1/2]
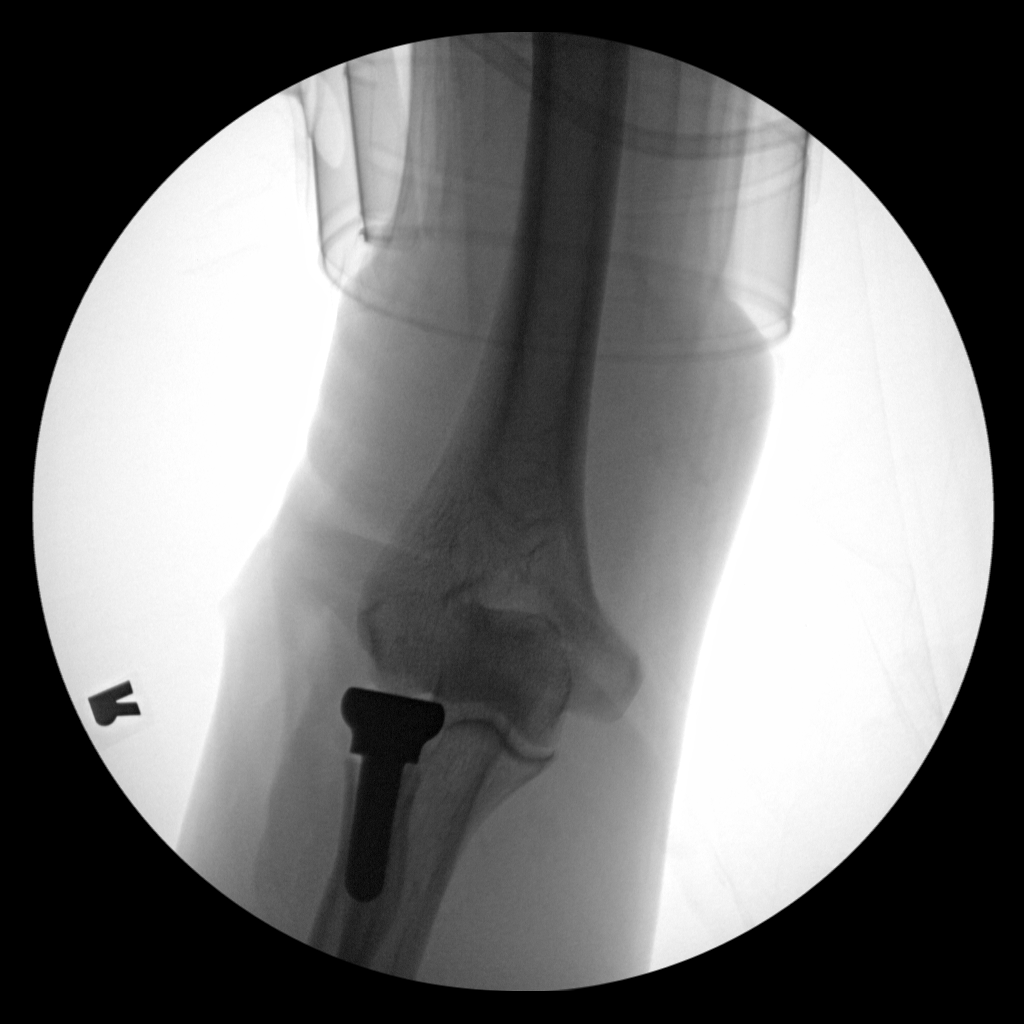
[im 2/2]
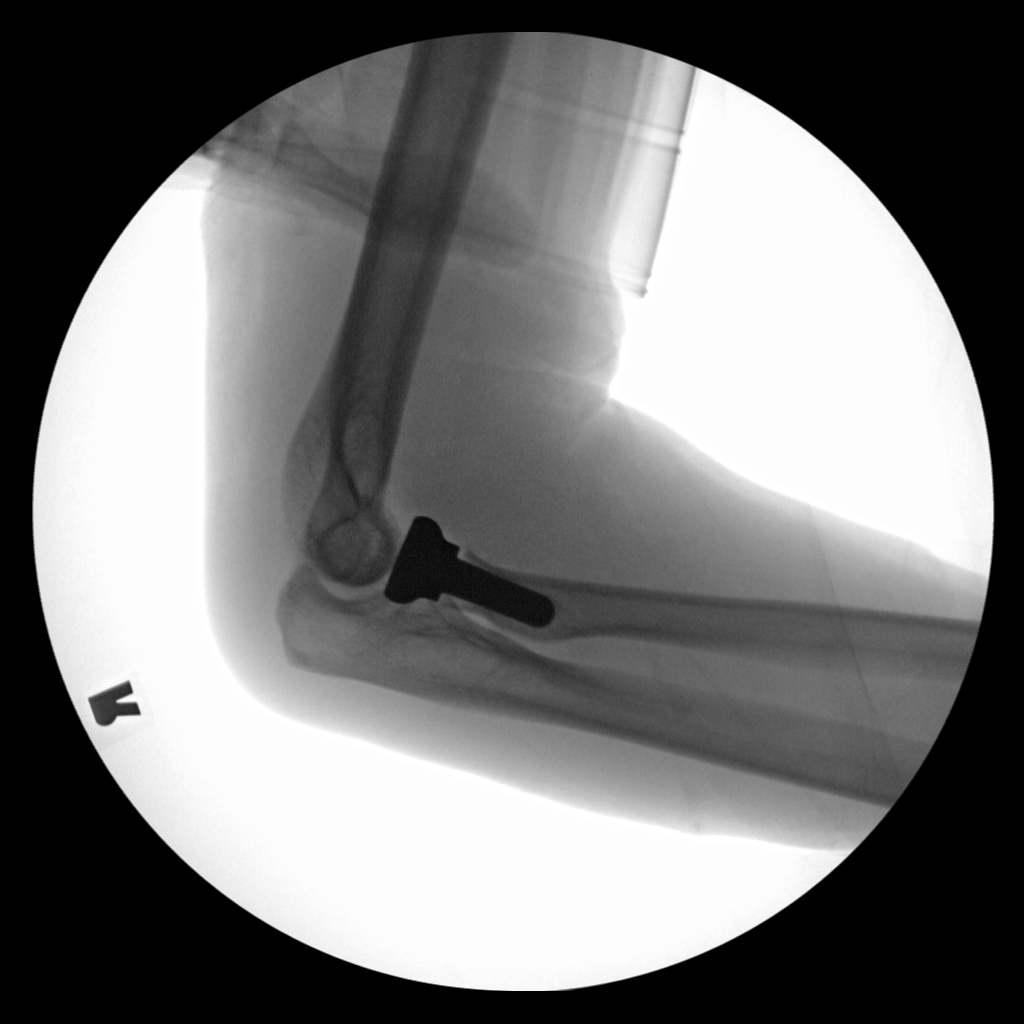

[2 of 2 positions shown; findings below may reference images not displayed]

FINDINGS: AP and lateral fluoroscopic spot images demonstrate resection of the
radial head with radial head prosthesis.
IMPRESSION: Radial head arthroplasty.

## 2015-04-01 ENCOUNTER — Other Ambulatory Visit: Payer: Self-pay | Admitting: *Deleted

## 2015-04-01 DIAGNOSIS — F329 Major depressive disorder, single episode, unspecified: Secondary | ICD-10-CM

## 2015-04-01 DIAGNOSIS — Z0000 Encounter for general adult medical examination without abnormal findings: Secondary | ICD-10-CM

## 2015-04-01 MED ORDER — CITALOPRAM HYDROBROMIDE 20 MG PO TABS: ORAL_TABLET | ORAL | Status: DC

## 2015-05-17 ENCOUNTER — Other Ambulatory Visit (INDEPENDENT_AMBULATORY_CARE_PROVIDER_SITE_OTHER): Payer: BLUE CROSS/BLUE SHIELD

## 2015-05-17 DIAGNOSIS — Z0000 Encounter for general adult medical examination without abnormal findings: Secondary | ICD-10-CM

## 2015-05-17 LAB — HEPATIC FUNCTION PANEL
ALT: 17 U/L (ref 0–53)
AST: 19 U/L (ref 0–37)
Albumin: 4.3 g/dL (ref 3.5–5.2)
Alkaline Phosphatase: 56 U/L (ref 39–117)
Bilirubin, Direct: 0.3 mg/dL (ref 0.0–0.3)
Total Bilirubin: 1.6 mg/dL — ABNORMAL HIGH (ref 0.2–1.2)
Total Protein: 6.5 g/dL (ref 6.0–8.3)

## 2015-05-17 LAB — BASIC METABOLIC PANEL
BUN: 20 mg/dL (ref 6–23)
CHLORIDE: 105 meq/L (ref 96–112)
CO2: 28 mEq/L (ref 19–32)
Calcium: 9.4 mg/dL (ref 8.4–10.5)
Creatinine, Ser: 1.04 mg/dL (ref 0.40–1.50)
GFR: 79.5 mL/min (ref >60.00)
Glucose, Bld: 88 mg/dL (ref 70–99)
Potassium: 4.3 mEq/L (ref 3.5–5.1)
Sodium: 141 mEq/L (ref 135–145)

## 2015-05-17 LAB — PSA: PSA: 1.75 ng/mL (ref 0.10–4.00)

## 2015-05-17 LAB — POC URINALSYSI DIPSTICK (AUTOMATED)
BILIRUBIN UA: NEGATIVE
Blood, UA: NEGATIVE
Glucose, UA: NEGATIVE
Ketones, UA: NEGATIVE
LEUKOCYTES UA: NEGATIVE
Nitrite, UA: NEGATIVE
PH UA: 7.5
Spec Grav, UA: 1.015
Urobilinogen, UA: 0.2

## 2015-05-17 LAB — CBC WITH DIFFERENTIAL/PLATELET
BASOS PCT: 0.4 % (ref 0.0–3.0)
Basophils Absolute: 0 10*3/uL (ref 0.0–0.1)
EOS PCT: 2.1 % (ref 0.0–5.0)
Eosinophils Absolute: 0.1 10*3/uL (ref 0.0–0.7)
HEMATOCRIT: 39.7 % (ref 39.0–52.0)
Hemoglobin: 13.6 g/dL (ref 13.0–17.0)
LYMPHS PCT: 33.9 % (ref 12.0–46.0)
Lymphs Abs: 1.7 10*3/uL (ref 0.7–4.0)
MCHC: 34.2 g/dL (ref 30.0–36.0)
MCV: 90.4 fl (ref 78.0–100.0)
Monocytes Absolute: 0.5 10*3/uL (ref 0.1–1.0)
Monocytes Relative: 9.6 % (ref 3.0–12.0)
Neutro Abs: 2.7 10*3/uL (ref 1.4–7.7)
Neutrophils Relative %: 54 % (ref 43.0–77.0)
Platelets: 259 10*3/uL (ref 150.0–400.0)
RBC: 4.39 Mil/uL (ref 4.22–5.81)
RDW: 13.1 % (ref 11.5–15.5)
WBC: 5 10*3/uL (ref 4.0–10.5)

## 2015-05-17 LAB — LIPID PANEL
CHOL/HDL RATIO: 2
Cholesterol: 130 mg/dL (ref 0–200)
HDL: 52.9 mg/dL (ref >39.00)
LDL Cholesterol: 56 mg/dL (ref 0–99)
NONHDL: 77.15
Triglycerides: 105 mg/dL (ref 0.0–149.0)
VLDL: 21 mg/dL (ref 0.0–40.0)

## 2015-05-17 LAB — TSH: TSH: 0.69 u[IU]/mL (ref 0.35–4.50)

## 2015-05-24 ENCOUNTER — Ambulatory Visit (INDEPENDENT_AMBULATORY_CARE_PROVIDER_SITE_OTHER): Payer: BLUE CROSS/BLUE SHIELD | Admitting: Family Medicine

## 2015-05-24 ENCOUNTER — Encounter: Payer: Self-pay | Admitting: Family Medicine

## 2015-05-24 VITALS — BP 124/84 | Temp 98.4°F | Ht 73.0 in | Wt 187.0 lb

## 2015-05-24 DIAGNOSIS — F329 Major depressive disorder, single episode, unspecified: Secondary | ICD-10-CM

## 2015-05-24 DIAGNOSIS — Z0000 Encounter for general adult medical examination without abnormal findings: Secondary | ICD-10-CM | POA: Diagnosis not present

## 2015-05-24 DIAGNOSIS — F4321 Adjustment disorder with depressed mood: Secondary | ICD-10-CM | POA: Diagnosis not present

## 2015-05-24 MED ORDER — CITALOPRAM HYDROBROMIDE 20 MG PO TABS
ORAL_TABLET | ORAL | Status: DC
Start: 2015-05-24 — End: 2016-05-01

## 2015-05-24 NOTE — Patient Instructions (Signed)
Continue the Celexa and aspirin  Return in one year for general physical exam sooner if any problems  Vincent [name] and Vincent [name],,,,,,,, are 2 new adult nurse practitioner's or Dr. [place]

## 2015-05-24 NOTE — Progress Notes (Signed)
Pre visit review using our clinic review tool, if applicable. No additional management support is needed unless otherwise documented below in the visit note. 

## 2015-05-24 NOTE — Progress Notes (Signed)
   Subjective:    Patient ID: Vincent [name], male    DOB: 06/27/1960, 55 y.o.   MRN: [id number]  HPI Vincent [name] is a 55 year old married male nonsmoker who comes in today for general physical examination  We last saw him in December 2015. Couple weeks after that he fell off the roof at his house sustained a pelvic fracture L1 fracture fracture right radius and fracture and right shoulder. He was hospitalized for a week. His surgery on his shoulder. L1 fracture resolve spontaneously without surgery. He said she wore brace for 3 months and tells me now he is pain free  He gets routine eye care, dental care, colonoscopy 2015 normal  Vaccinations up-to-date tetanus booster 2011  He takes Celexa 20 mg at bedtime along with an aspirin tablet.   Review of Systems  Constitutional: Negative.   HENT: Negative.   Eyes: Negative.   Respiratory: Negative.   Cardiovascular: Negative.   Gastrointestinal: Negative.   Endocrine: Negative.   Genitourinary: Negative.   Musculoskeletal: Negative.   Skin: Negative.   Allergic/Immunologic: Negative.   Neurological: Negative.   Hematological: Negative.   Psychiatric/Behavioral: Negative.        Objective:   Physical Exam  Constitutional: He is oriented to person, place, and time. He appears well-developed and well-nourished.  HENT:  Head: Normocephalic and atraumatic.  Right Ear: External ear normal.  Left Ear: External ear normal.  Nose: Nose normal.  Mouth/Throat: Oropharynx is clear and moist.  Eyes: Conjunctivae and EOM are normal. Pupils are equal, round, and reactive to light.  Neck: Normal range of motion. Neck supple. No JVD present. No tracheal deviation present. No thyromegaly present.  Cardiovascular: Normal rate, regular rhythm, normal heart sounds and intact distal pulses.  Exam reveals no gallop and no friction rub.   No murmur heard. Pulmonary/Chest: Effort normal and breath sounds normal. No stridor. No respiratory distress. He  has no wheezes. He has no rales. He exhibits no tenderness.  Abdominal: Soft. Bowel sounds are normal. He exhibits no distension and no mass. There is no tenderness. There is no rebound and no guarding.  Genitourinary: Rectum normal, prostate normal and penis normal. Guaiac negative stool. No penile tenderness.  Musculoskeletal: Normal range of motion. He exhibits no edema or tenderness.  Lymphadenopathy:    He has no cervical adenopathy.  Neurological: He is alert and oriented to person, place, and time. He has normal reflexes. No cranial nerve deficit. He exhibits normal muscle tone.  Skin: Skin is warm and dry. No rash noted. No erythema. No pallor.  Well-healed scar right shoulder from previous shoulder surgery  Psychiatric: He has a normal mood and affect. His behavior is normal. Judgment and thought content normal.  Nursing note and vitals reviewed.         Assessment & Plan:  Healthy male  Status post fall with fractured pelvis right radius right shoulder and L1 vertebrae.... Recovered without sequelae  History of mild depression........ continue Celexa 20 mg daily...Marland KitchenMarland Kitchen

## 2016-04-04 DIAGNOSIS — L570 Actinic keratosis: Secondary | ICD-10-CM | POA: Diagnosis not present

## 2016-04-04 DIAGNOSIS — D225 Melanocytic nevi of trunk: Secondary | ICD-10-CM | POA: Diagnosis not present

## 2016-04-04 DIAGNOSIS — D2271 Melanocytic nevi of right lower limb, including hip: Secondary | ICD-10-CM | POA: Diagnosis not present

## 2016-04-04 DIAGNOSIS — L814 Other melanin hyperpigmentation: Secondary | ICD-10-CM | POA: Diagnosis not present

## 2016-05-01 ENCOUNTER — Other Ambulatory Visit: Payer: Self-pay | Admitting: Family Medicine

## 2016-05-01 DIAGNOSIS — F329 Major depressive disorder, single episode, unspecified: Secondary | ICD-10-CM

## 2016-05-01 DIAGNOSIS — Z0000 Encounter for general adult medical examination without abnormal findings: Secondary | ICD-10-CM

## 2016-08-01 ENCOUNTER — Encounter: Payer: Self-pay | Admitting: Family Medicine

## 2016-08-01 ENCOUNTER — Ambulatory Visit (INDEPENDENT_AMBULATORY_CARE_PROVIDER_SITE_OTHER): Payer: BLUE CROSS/BLUE SHIELD | Admitting: Family Medicine

## 2016-08-01 VITALS — BP 126/84 | Temp 98.2°F | Wt 187.0 lb

## 2016-08-01 DIAGNOSIS — S41132A Puncture wound without foreign body of left upper arm, initial encounter: Secondary | ICD-10-CM

## 2016-08-01 DIAGNOSIS — S41032A Puncture wound without foreign body of left shoulder, initial encounter: Secondary | ICD-10-CM

## 2016-08-01 MED ORDER — CEPHALEXIN 500 MG PO CAPS: 500.0000 mg | ORAL_CAPSULE | Freq: Four times a day (QID) | ORAL | 1 refills | Status: DC

## 2016-08-01 NOTE — Progress Notes (Signed)
Vincent [name] is a [age]-year-old male who comes in today for evaluation of a puncture wound 2 left foot  Last Saturday stepped on 2 nails 1 affect his left great toe the other in the base of the foot. He did local cleaning tetanus booster 2011 he felt okay until today when he notices started to get tender. No fever chills or erythema.  BP 126/84 (BP Location: Right Arm)   Temp 98.2 F (36.8 C) (Oral)   Wt 187 lb (84.8 kg)   BMI 24.67 kg/m  Generally well-developed well-nourished male no acute distress examination of foot shows 2 small puncture wounds. The one in the base of foot looks like it has a piece of stockinette. After alcohol prep it was opened and drained. No pus.  Puncture wound 2 with secondary infection............ hot soaks and antibiotics return when necessary

## 2016-08-01 NOTE — Patient Instructions (Signed)
Keflex 500 mg.......... 2 tabs twice daily for 1 week  Soak your foot in warm water twice daily. Cover the areas with a Band-Aid and some antibiotic ointment until well

## 2016-12-10 ENCOUNTER — Encounter: Payer: Self-pay | Admitting: Family Medicine

## 2016-12-10 ENCOUNTER — Ambulatory Visit (INDEPENDENT_AMBULATORY_CARE_PROVIDER_SITE_OTHER): Payer: BLUE CROSS/BLUE SHIELD | Admitting: Family Medicine

## 2016-12-10 VITALS — BP 122/94 | HR 76 | Temp 98.1°F | Ht 73.0 in | Wt 184.0 lb

## 2016-12-10 DIAGNOSIS — R21 Rash and other nonspecific skin eruption: Secondary | ICD-10-CM | POA: Diagnosis not present

## 2016-12-10 DIAGNOSIS — F329 Major depressive disorder, single episode, unspecified: Secondary | ICD-10-CM | POA: Diagnosis not present

## 2016-12-10 DIAGNOSIS — Z0001 Encounter for general adult medical examination with abnormal findings: Secondary | ICD-10-CM

## 2016-12-10 DIAGNOSIS — H6123 Impacted cerumen, bilateral: Secondary | ICD-10-CM | POA: Diagnosis not present

## 2016-12-10 DIAGNOSIS — Z0000 Encounter for general adult medical examination without abnormal findings: Secondary | ICD-10-CM

## 2016-12-10 LAB — CBC WITH DIFFERENTIAL/PLATELET
Basophils Absolute: 0 10*3/uL (ref 0.0–0.1)
Basophils Relative: 0.7 % (ref 0.0–3.0)
EOS PCT: 0.8 % (ref 0.0–5.0)
Eosinophils Absolute: 0 10*3/uL (ref 0.0–0.7)
HCT: 44.4 % (ref 39.0–52.0)
HEMOGLOBIN: 14.8 g/dL (ref 13.0–17.0)
Lymphocytes Relative: 33 % (ref 12.0–46.0)
Lymphs Abs: 1.6 10*3/uL (ref 0.7–4.0)
MCHC: 33.4 g/dL (ref 30.0–36.0)
MCV: 95.2 fl (ref 78.0–100.0)
MONOS PCT: 7.3 % (ref 3.0–12.0)
Monocytes Absolute: 0.4 10*3/uL (ref 0.1–1.0)
Neutro Abs: 2.9 10*3/uL (ref 1.4–7.7)
Neutrophils Relative %: 58.2 % (ref 43.0–77.0)
Platelets: 221 10*3/uL (ref 150.0–400.0)
RBC: 4.66 Mil/uL (ref 4.22–5.81)
RDW: 13.4 % (ref 11.5–15.5)
WBC: 5 10*3/uL (ref 4.0–10.5)

## 2016-12-10 LAB — HEPATIC FUNCTION PANEL
ALT: 20 U/L (ref 0–53)
AST: 22 U/L (ref 0–37)
Albumin: 4.4 g/dL (ref 3.5–5.2)
Alkaline Phosphatase: 47 U/L (ref 39–117)
Bilirubin, Direct: 0.3 mg/dL (ref 0.0–0.3)
Total Bilirubin: 1.7 mg/dL — ABNORMAL HIGH (ref 0.2–1.2)
Total Protein: 6.6 g/dL (ref 6.0–8.3)

## 2016-12-10 LAB — LIPID PANEL
Cholesterol: 172 mg/dL (ref 0–200)
HDL: 62.1 mg/dL (ref >39.00)
LDL Cholesterol: 86 mg/dL (ref 0–99)
NONHDL: 110.36
Total CHOL/HDL Ratio: 3
Triglycerides: 124 mg/dL (ref 0.0–149.0)
VLDL: 24.8 mg/dL (ref 0.0–40.0)

## 2016-12-10 LAB — POCT URINALYSIS DIPSTICK
Bilirubin, UA: NEGATIVE
Blood, UA: NEGATIVE
Glucose, UA: NEGATIVE
KETONES UA: NEGATIVE
LEUKOCYTES UA: NEGATIVE
Nitrite, UA: NEGATIVE
PROTEIN UA: NEGATIVE
Spec Grav, UA: 1.015 (ref 1.010–1.025)
Urobilinogen, UA: 0.2 E.U./dL
pH, UA: 7 (ref 5.0–8.0)

## 2016-12-10 LAB — BASIC METABOLIC PANEL
BUN: 17 mg/dL (ref 6–23)
CHLORIDE: 102 meq/L (ref 96–112)
CO2: 29 mEq/L (ref 19–32)
CREATININE: 1.04 mg/dL (ref 0.40–1.50)
Calcium: 9.3 mg/dL (ref 8.4–10.5)
GFR: 79.02 mL/min (ref >60.00)
Glucose, Bld: 100 mg/dL — ABNORMAL HIGH (ref 70–99)
Potassium: 3.9 mEq/L (ref 3.5–5.1)
Sodium: 139 mEq/L (ref 135–145)

## 2016-12-10 LAB — TSH: TSH: 0.55 u[IU]/mL (ref 0.35–4.50)

## 2016-12-10 MED ORDER — CITALOPRAM HYDROBROMIDE 20 MG PO TABS: ORAL_TABLET | ORAL | 4 refills | Status: DC

## 2016-12-10 MED ORDER — PREDNISONE 20 MG PO TABS: ORAL_TABLET | ORAL | 1 refills | Status: DC

## 2016-12-10 NOTE — Progress Notes (Signed)
Yvone Neu is a 56 year old male nonsmoker who comes in today for general physical examination  He takes Celexa 20 mg daily at bedtime at bedtime because a history of mild depression. Doing well  2 years ago he had a fall multiple fractures although is healed.  In the past she's had a skin cancer removed from his right arm. Her group in [place] and has light skin and light eyes.  He gets routine eye care, dental care, colonoscopy 2015 normal  He exercises daily he runs and lifts weights.  Family history unchanged  Vaccinations seasonal flu shot he'll get it work. Tetanus booster 2011  14 point review of system review otherwise negative except for a rash on his right arm and leg he said [place]. It's red and pruritic. It's been there for couple weeks and seems to be getting worse.  BP (!) 122/94 (BP Location: Left Arm, Patient Position: Sitting, Cuff Size: Normal)   Pulse 76   Temp 98.1 F (36.7 C) (Oral)   Ht 6\' 1"  (1.854 m)   Wt 184 lb (83.5 kg)   BMI 24.28 kg/m  In general he's well-developed well-nourished male no acute distress examination HEENT were negative, except for bilateral cerumen impactions. neck was supple thyroid not enlarged no carotid bruits cardiopulmonary exam normal abdominal exam normal genitalia normal circumcised male rectum normal stool guaiac-negative prostate normal extremities normal skin normal peripheral pulses normal except for 3 of scar right shoulder from previous skin cancer removal in [place].  #1 healthy male  #2 history of mild depression,,,,,, continue Celexa  #3 decreased hearing secondary to bilateral cerumen impactions,,,,,,,, removed with suction irrigation.

## 2016-12-10 NOTE — Patient Instructions (Addendum)
Continue good diet exercise and health habits  Return in one year for general physical exam sooner if any problems  Labs today,,,,,,,, I will call you if there is anything abnormal  Prednisone 20 mg......... 2 tabs 3 days, 1 tab 3 days, half a tab 3 days, then stop if your skin has completely healed............ if not completely healed and do a half a tab Monday Wednesday Friday for a couple weeks

## 2016-12-12 ENCOUNTER — Encounter: Payer: BLUE CROSS/BLUE SHIELD | Admitting: Family Medicine

## 2017-04-04 DIAGNOSIS — D485 Neoplasm of uncertain behavior of skin: Secondary | ICD-10-CM | POA: Diagnosis not present

## 2017-04-04 DIAGNOSIS — D2272 Melanocytic nevi of left lower limb, including hip: Secondary | ICD-10-CM | POA: Diagnosis not present

## 2017-04-04 DIAGNOSIS — L821 Other seborrheic keratosis: Secondary | ICD-10-CM | POA: Diagnosis not present

## 2017-04-04 DIAGNOSIS — L570 Actinic keratosis: Secondary | ICD-10-CM | POA: Diagnosis not present

## 2017-04-04 DIAGNOSIS — L814 Other melanin hyperpigmentation: Secondary | ICD-10-CM | POA: Diagnosis not present

## 2017-04-04 DIAGNOSIS — Z85828 Personal history of other malignant neoplasm of skin: Secondary | ICD-10-CM | POA: Diagnosis not present

## 2017-04-28 ENCOUNTER — Other Ambulatory Visit: Payer: Self-pay | Admitting: Family Medicine

## 2017-04-28 DIAGNOSIS — F329 Major depressive disorder, single episode, unspecified: Secondary | ICD-10-CM

## 2017-04-28 DIAGNOSIS — Z0000 Encounter for general adult medical examination without abnormal findings: Secondary | ICD-10-CM

## 2018-04-22 DIAGNOSIS — D2272 Melanocytic nevi of left lower limb, including hip: Secondary | ICD-10-CM | POA: Diagnosis not present

## 2018-04-22 DIAGNOSIS — D485 Neoplasm of uncertain behavior of skin: Secondary | ICD-10-CM | POA: Diagnosis not present

## 2018-04-22 DIAGNOSIS — Z85828 Personal history of other malignant neoplasm of skin: Secondary | ICD-10-CM | POA: Diagnosis not present

## 2018-04-22 DIAGNOSIS — D0462 Carcinoma in situ of skin of left upper limb, including shoulder: Secondary | ICD-10-CM | POA: Diagnosis not present

## 2018-04-22 DIAGNOSIS — Z86018 Personal history of other benign neoplasm: Secondary | ICD-10-CM | POA: Diagnosis not present

## 2018-04-22 DIAGNOSIS — L814 Other melanin hyperpigmentation: Secondary | ICD-10-CM | POA: Diagnosis not present

## 2018-05-21 DIAGNOSIS — D0462 Carcinoma in situ of skin of left upper limb, including shoulder: Secondary | ICD-10-CM | POA: Diagnosis not present

## 2018-05-21 DIAGNOSIS — L988 Other specified disorders of the skin and subcutaneous tissue: Secondary | ICD-10-CM | POA: Diagnosis not present

## 2018-10-20 DIAGNOSIS — H524 Presbyopia: Secondary | ICD-10-CM | POA: Diagnosis not present

## 2018-11-14 ENCOUNTER — Ambulatory Visit (INDEPENDENT_AMBULATORY_CARE_PROVIDER_SITE_OTHER): Payer: BC Managed Care – PPO | Admitting: Family Medicine

## 2018-11-14 ENCOUNTER — Telehealth: Payer: Self-pay | Admitting: *Deleted

## 2018-11-14 ENCOUNTER — Other Ambulatory Visit: Payer: Self-pay

## 2018-11-14 ENCOUNTER — Encounter: Payer: Self-pay | Admitting: Family Medicine

## 2018-11-14 VITALS — Wt 181.0 lb

## 2018-11-14 DIAGNOSIS — R030 Elevated blood-pressure reading, without diagnosis of hypertension: Secondary | ICD-10-CM | POA: Diagnosis not present

## 2018-11-14 DIAGNOSIS — Z131 Encounter for screening for diabetes mellitus: Secondary | ICD-10-CM | POA: Diagnosis not present

## 2018-11-14 DIAGNOSIS — F329 Major depressive disorder, single episode, unspecified: Secondary | ICD-10-CM | POA: Diagnosis not present

## 2018-11-14 DIAGNOSIS — Z13220 Encounter for screening for lipoid disorders: Secondary | ICD-10-CM | POA: Diagnosis not present

## 2018-11-14 MED ORDER — CITALOPRAM HYDROBROMIDE 20 MG PO TABS: 20.0000 mg | ORAL_TABLET | Freq: Every day | ORAL | 3 refills | Status: DC

## 2018-11-14 NOTE — Telephone Encounter (Signed)
Appts scheduled as below.  

## 2018-11-14 NOTE — Telephone Encounter (Signed)
-----   Message from Caren Macadam, MD sent at 11/14/2018 10:24 AM EDT ----- Please arrange bloodwork followed by physical in office

## 2018-11-14 NOTE — Progress Notes (Signed)
Virtual Visit via Video Note  I connected with Vincent [name]   on 11/14/18 at 10:00 AM EDT by a video enabled telemedicine application and verified that I am speaking with the correct person using two identifiers.  Location patient: home Location provider:work office Persons participating in the virtual visit: patient, provider  I discussed the limitations of evaluation and management by telemedicine and the availability of in person appointments. The patient expressed understanding and agreed to proceed.   Vincent [name] DOB: Jan 05, 1961 Encounter date: 11/14/2018  This is a 58 y.o. male who presents to establish care. Former patient of Dr. Sherren Mocha last seen 12/2016.   Chief Complaint  Patient presents with  . Establish Care    History of present illness: No specific concerns today. Would like to schedule physical.   celexa 20mg  daily for hx of mild depression. Tolerates this well and feels that this does a good job.   Last colonoscopy: last in 2015; 10 year repeat  Skin cancer: follows with derm for this. Forgetting name of dermatologist, but does go yearly.   Last bp was elevated in office? 122/94? Gave blood Monday and was 124/82.   Does exercise on regular basis. Fitness classes at club (outside now), yoga, running.   Past Medical History:  Diagnosis Date  . Anxiety   . Mild depression ([place])   . Skin cancer    right shoulder   Past Surgical History:  Procedure Laterality Date  . CYST REMOVAL HAND     left hand/benign  . ORIF HUMERUS FRACTURE Right 02/25/2014   Procedure: OPEN REDUCTION INTERNAL FIXATION (ORIF) PROXIMAL HUMERUS FRACTURE;  Surgeon: [name], MD;  Location: [facility];  Service: Orthopedics;  Laterality: Right;  . RADIAL HEAD ARTHROPLASTY Right 02/25/2014   Procedure: RADIAL HEAD ARTHROPLASTY;  Surgeon: [name], MD;  Location: [facility];  Service: Orthopedics;  Laterality: Right;  . SKIN BIOPSY    . SKIN CANCER EXCISION     right  shoulder, removed by Dr Tonia Brooms   No Known Allergies Current Meds  Medication Sig  . aspirin EC 325 MG EC tablet Take 1 tablet (325 mg total) by mouth 2 (two) times daily.  . citalopram (CELEXA) 20 MG tablet Take 1 tablet (20 mg total) by mouth daily.  . Multiple Vitamins-Minerals (ONE-A-DAY 50 PLUS PO) Take by mouth daily.  . [DISCONTINUED] citalopram (CELEXA) 20 MG tablet TAKE 1 TABLET EVERY BEDTIME (MUST KEEP APPOINTMENT)   Social History   Tobacco Use  . Smoking status: Never Smoker  . Smokeless tobacco: Never Used  Substance Use Topics  . Alcohol use: Yes    Alcohol/week: 7.0 standard drinks    Types: 7 Glasses of wine per week   Family History  Problem Relation Age of Onset  . Stroke Mother   . Diverticulitis Brother   . Hernia Brother   . Skin cancer Father   . Other Father        fall with head injury-ended up in hospital  . Migraines Sister   . Colon cancer Neg Hx   . Rectal cancer Neg Hx   . Stomach cancer Neg Hx      Review of Systems  Constitutional: Negative for chills, fatigue and fever.  Respiratory: Negative for cough, chest tightness, shortness of breath and wheezing.   Cardiovascular: Negative for chest pain, palpitations and leg swelling.    Objective:  Wt 181 lb (82.1 kg) Comment: taken by pt-jaf  BMI 23.88 kg/m   Weight: 181  lb (82.1 kg)(taken by pt-jaf)   BP Readings from Last 3 Encounters:  12/10/16 (!) 122/94  08/01/16 126/84  05/24/15 124/84   Wt Readings from Last 3 Encounters:  11/14/18 181 lb (82.1 kg)  12/10/16 184 lb (83.5 kg)  08/01/16 187 lb (84.8 kg)    EXAM:  GENERAL: alert, oriented, appears well and in no acute distress  HEENT: atraumatic, conjunctiva clear, no obvious abnormalities on inspection of external nose and ears  NECK: normal movements of the head and neck  LUNGS: on inspection no signs of respiratory distress, breathing rate appears normal, no obvious gross SOB, gasping or wheezing  CV: no obvious  cyanosis  MS: moves all visible extremities without noticeable abnormality  PSYCH/NEURO: pleasant and cooperative, no obvious depression or anxiety, speech and thought processing grossly intact  SKIN: no facial or neck abnormalities.   Depression screen [facility] 2/9 11/14/2018  Decreased Interest 0  Down, Depressed, Hopeless 0  PHQ - 2 Score 0    Assessment/Plan  1. Reactive depression (situational) Stable. Continue current medication.  - citalopram (CELEXA) 20 MG tablet; Take 1 tablet (20 mg total) by mouth daily.  Dispense: 90 tablet; Refill: 3  2. Lipid screening - Lipid panel; Future  3. Screening for diabetes mellitus - Comprehensive metabolic panel; Future  4. Elevated blood pressure reading Was ok when checked earlier this week when donating blood but slightly elevated when last in office. Will recheck at upcoming physical.  - CBC with Differential/Platelet; Future - Comprehensive metabolic panel; Future  I discussed the assessment and treatment plan with the patient. The patient was provided an opportunity to ask questions and all were answered. The patient agreed with the plan and demonstrated an understanding of the instructions.   The patient was advised to call back or seek an in-person evaluation if the symptoms worsen or if the condition fails to improve as anticipated.  I provided 15 minutes of non-face-to-face time during this encounter.   Micheline Rough, MD

## 2018-12-24 ENCOUNTER — Other Ambulatory Visit: Payer: Self-pay

## 2018-12-24 ENCOUNTER — Other Ambulatory Visit (INDEPENDENT_AMBULATORY_CARE_PROVIDER_SITE_OTHER): Payer: BC Managed Care – PPO

## 2018-12-24 DIAGNOSIS — Z13220 Encounter for screening for lipoid disorders: Secondary | ICD-10-CM

## 2018-12-24 DIAGNOSIS — Z131 Encounter for screening for diabetes mellitus: Secondary | ICD-10-CM | POA: Diagnosis not present

## 2018-12-24 DIAGNOSIS — R030 Elevated blood-pressure reading, without diagnosis of hypertension: Secondary | ICD-10-CM | POA: Diagnosis not present

## 2018-12-24 LAB — CBC WITH DIFFERENTIAL/PLATELET
Basophils Absolute: 0 10*3/uL (ref 0.0–0.1)
Basophils Relative: 0.5 % (ref 0.0–3.0)
Eosinophils Absolute: 0.1 10*3/uL (ref 0.0–0.7)
Eosinophils Relative: 1.7 % (ref 0.0–5.0)
HCT: 44.3 % (ref 39.0–52.0)
Hemoglobin: 15 g/dL (ref 13.0–17.0)
Lymphocytes Relative: 32 % (ref 12.0–46.0)
Lymphs Abs: 1.8 10*3/uL (ref 0.7–4.0)
MCHC: 33.8 g/dL (ref 30.0–36.0)
MCV: 94.6 fl (ref 78.0–100.0)
Monocytes Absolute: 0.4 10*3/uL (ref 0.1–1.0)
Monocytes Relative: 7.2 % (ref 3.0–12.0)
Neutro Abs: 3.4 10*3/uL (ref 1.4–7.7)
Neutrophils Relative %: 58.6 % (ref 43.0–77.0)
Platelets: 249 10*3/uL (ref 150.0–400.0)
RBC: 4.68 Mil/uL (ref 4.22–5.81)
RDW: 13.4 % (ref 11.5–15.5)
WBC: 5.7 10*3/uL (ref 4.0–10.5)

## 2018-12-24 LAB — COMPREHENSIVE METABOLIC PANEL
ALT: 23 U/L (ref 0–53)
AST: 20 U/L (ref 0–37)
Albumin: 4.4 g/dL (ref 3.5–5.2)
Alkaline Phosphatase: 47 U/L (ref 39–117)
BUN: 21 mg/dL (ref 6–23)
CO2: 26 mEq/L (ref 19–32)
Calcium: 9.4 mg/dL (ref 8.4–10.5)
Chloride: 103 mEq/L (ref 96–112)
Creatinine, Ser: 1.05 mg/dL (ref 0.40–1.50)
GFR: 72.99 mL/min (ref >60.00)
Glucose, Bld: 87 mg/dL (ref 70–99)
Potassium: 4.2 mEq/L (ref 3.5–5.1)
Sodium: 138 mEq/L (ref 135–145)
Total Bilirubin: 0.9 mg/dL (ref 0.2–1.2)
Total Protein: 6.8 g/dL (ref 6.0–8.3)

## 2018-12-24 LAB — LIPID PANEL
Cholesterol: 211 mg/dL — ABNORMAL HIGH (ref 0–200)
HDL: 60.2 mg/dL (ref >39.00)
LDL Cholesterol: 121 mg/dL — ABNORMAL HIGH (ref 0–99)
NonHDL: 151.02
Total CHOL/HDL Ratio: 4
Triglycerides: 149 mg/dL (ref 0.0–149.0)
VLDL: 29.8 mg/dL (ref 0.0–40.0)

## 2018-12-31 ENCOUNTER — Other Ambulatory Visit: Payer: Self-pay

## 2018-12-31 ENCOUNTER — Ambulatory Visit (INDEPENDENT_AMBULATORY_CARE_PROVIDER_SITE_OTHER): Payer: BC Managed Care – PPO | Admitting: Family Medicine

## 2018-12-31 ENCOUNTER — Encounter: Payer: Self-pay | Admitting: Family Medicine

## 2018-12-31 VITALS — BP 110/80 | HR 76 | Temp 97.8°F | Ht 72.25 in | Wt 185.0 lb

## 2018-12-31 DIAGNOSIS — Z0000 Encounter for general adult medical examination without abnormal findings: Secondary | ICD-10-CM

## 2018-12-31 DIAGNOSIS — Z23 Encounter for immunization: Secondary | ICD-10-CM

## 2018-12-31 DIAGNOSIS — F329 Major depressive disorder, single episode, unspecified: Secondary | ICD-10-CM | POA: Diagnosis not present

## 2018-12-31 DIAGNOSIS — H6123 Impacted cerumen, bilateral: Secondary | ICD-10-CM | POA: Diagnosis not present

## 2018-12-31 NOTE — Patient Instructions (Addendum)
Tdap due in April.   Consider shingles vaccine.

## 2018-12-31 NOTE — Addendum Note (Signed)
Addended by: Agnes Lawrence on: 12/31/2018 12:13 PM   Modules accepted: Orders

## 2018-12-31 NOTE — Progress Notes (Signed)
Vincent [name] DOB: Jul 14, 1960 Encounter date: 12/31/2018  This is a 58 y.o. male who presents for complete physical   History of present illness/Additional concerns: celexa 20mg  daily for hx of mild depression. Tolerates this well and feels that this does a good job.   Last colonoscopy: last in 2015; 10 year repeat  Skin cancer: follows with derm for this. Follows with Dr. Delman Cheadle.   Does exercise on regular basis. Fitness classes at club (outside now), yoga, running.  Has had issues with borderline high blood pressure; plan was to recheck today in office.  Sees dentist regularly.  Past Medical History:  Diagnosis Date  . Anxiety   . Mild depression ([place])   . Skin cancer    right shoulder   Past Surgical History:  Procedure Laterality Date  . CYST REMOVAL HAND     left hand/benign  . ORIF HUMERUS FRACTURE Right 02/25/2014   Procedure: OPEN REDUCTION INTERNAL FIXATION (ORIF) PROXIMAL HUMERUS FRACTURE;  Surgeon: [name], MD;  Location: [place];  Service: Orthopedics;  Laterality: Right;  . RADIAL HEAD ARTHROPLASTY Right 02/25/2014   Procedure: RADIAL HEAD ARTHROPLASTY;  Surgeon: [name], MD;  Location: [place];  Service: Orthopedics;  Laterality: Right;  . SKIN BIOPSY    . SKIN CANCER EXCISION     right shoulder, removed by Dr Tonia Brooms   No Known Allergies Current Meds  Medication Sig  . aspirin EC 325 MG EC tablet Take 1 tablet (325 mg total) by mouth 2 (two) times daily.  . citalopram (CELEXA) 20 MG tablet Take 1 tablet (20 mg total) by mouth daily.  . Multiple Vitamins-Minerals (ONE-A-DAY 50 PLUS PO) Take by mouth daily.   Social History   Tobacco Use  . Smoking status: Never Smoker  . Smokeless tobacco: Never Used  Substance Use Topics  . Alcohol use: Yes    Alcohol/week: 7.0 standard drinks    Types: 7 Glasses of wine per week   Family History  Problem Relation Age of Onset  . Stroke Mother   . Diverticulitis Brother   . Hernia  Brother   . Skin cancer Father   . Other Father        fall with head injury-ended up in hospital  . Migraines Sister   . Colon cancer Neg Hx   . Rectal cancer Neg Hx   . Stomach cancer Neg Hx      Review of Systems  Constitutional: Negative for activity change, appetite change, chills, fatigue, fever and unexpected weight change.  HENT: Negative for congestion, ear pain, hearing loss, sinus pressure, sinus pain, sore throat and trouble swallowing.   Eyes: Negative for pain and visual disturbance.  Respiratory: Negative for cough, chest tightness, shortness of breath and wheezing.   Cardiovascular: Negative for chest pain, palpitations and leg swelling.  Gastrointestinal: Negative for abdominal distention, abdominal pain, blood in stool, constipation, diarrhea, nausea and vomiting.  Genitourinary: Negative for decreased urine volume, difficulty urinating, dysuria, penile pain and testicular pain.  Musculoskeletal: Negative for arthralgias, back pain and joint swelling.  Skin: Negative for rash.  Neurological: Negative for dizziness, weakness, numbness and headaches.  Hematological: Negative for adenopathy. Does not bruise/bleed easily.  Psychiatric/Behavioral: Negative for agitation, sleep disturbance and suicidal ideas. The patient is not nervous/anxious.     CBC:  Lab Results  Component Value Date   WBC 5.7 12/24/2018   HGB 15.0 12/24/2018   HCT 44.3 12/24/2018   MCH 31.5 02/26/2014   MCHC 33.8 12/24/2018  RDW 13.4 12/24/2018   PLT 249.0 12/24/2018   CMP: Lab Results  Component Value Date   NA 138 12/24/2018   K 4.2 12/24/2018   CL 103 12/24/2018   CO2 26 12/24/2018   ANIONGAP 11 02/26/2014   GLUCOSE 87 12/24/2018   BUN 21 12/24/2018   CREATININE 1.05 12/24/2018   GFRAA >90 02/26/2014   CALCIUM 9.4 12/24/2018   PROT 6.8 12/24/2018   BILITOT 0.9 12/24/2018   ALKPHOS 47 12/24/2018   ALT 23 12/24/2018   AST 20 12/24/2018   LIPID: Lab Results  Component Value  Date   CHOL 211 (H) 12/24/2018   TRIG 149.0 12/24/2018   HDL 60.20 12/24/2018   LDLCALC 121 (H) 12/24/2018    Objective:  BP 110/80 (BP Location: Left Arm, Patient Position: Sitting, Cuff Size: Large)   Pulse 76   Temp 97.8 F (36.6 C) (Temporal)   Ht 6' 0.25" (1.835 m)   Wt 185 lb (83.9 kg)   SpO2 98%   BMI 24.92 kg/m   Weight: 185 lb (83.9 kg)   BP Readings from Last 3 Encounters:  12/31/18 110/80  12/10/16 (!) 122/94  08/01/16 126/84   Wt Readings from Last 3 Encounters:  12/31/18 185 lb (83.9 kg)  11/14/18 181 lb (82.1 kg)  12/10/16 184 lb (83.5 kg)    Physical Exam Constitutional:      General: He is not in acute distress.    Appearance: He is well-developed.  HENT:     Head: Normocephalic and atraumatic.     Right Ear: External ear normal.     Left Ear: External ear normal.     Nose: Nose normal.     Mouth/Throat:     Pharynx: No oropharyngeal exudate.  Eyes:     Conjunctiva/sclera: Conjunctivae normal.     Pupils: Pupils are equal, round, and reactive to light.  Neck:     Musculoskeletal: Neck supple.     Thyroid: No thyromegaly.  Cardiovascular:     Rate and Rhythm: Normal rate and regular rhythm.     Heart sounds: Normal heart sounds. No murmur. No friction rub. No gallop.   Pulmonary:     Effort: Pulmonary effort is normal. No respiratory distress.     Breath sounds: Normal breath sounds. No stridor. No wheezing or rales.  Abdominal:     General: Bowel sounds are normal.     Palpations: Abdomen is soft.  Genitourinary:    Penis: Normal and circumcised.      Prostate: Enlarged (5).     Rectum: Normal. Guaiac result negative. No external hemorrhoid or internal hemorrhoid. Normal anal tone.  Musculoskeletal: Normal range of motion.  Skin:    General: Skin is warm and dry.  Neurological:     Mental Status: He is alert and oriented to person, place, and time.  Psychiatric:        Behavior: Behavior normal.        Thought Content: Thought content  normal.        Judgment: Judgment normal.    Depression screen [facility] 2/9 12/31/2018 11/14/2018  Decreased Interest 0 0  Down, Depressed, Hopeless 0 0  PHQ - 2 Score 0 0     Assessment/Plan: Health Maintenance Due  Topic Date Due  . INFLUENZA VACCINE  10/11/2018   Health Maintenance reviewed.  1. Preventative health care keep up with healthy lifestyle. We discussed low cholesterol diet to help keep LDL controlled. Will plan to recheck in 1 years time.  2. Reactive depression (situational) Stable with celexa. Continue this medication.   Return in about 1 year (around 12/31/2019) for physical exam.  Micheline Rough, MD

## 2019-05-08 DIAGNOSIS — Z85828 Personal history of other malignant neoplasm of skin: Secondary | ICD-10-CM | POA: Diagnosis not present

## 2019-05-08 DIAGNOSIS — L2389 Allergic contact dermatitis due to other agents: Secondary | ICD-10-CM | POA: Diagnosis not present

## 2019-05-08 DIAGNOSIS — D2272 Melanocytic nevi of left lower limb, including hip: Secondary | ICD-10-CM | POA: Diagnosis not present

## 2019-05-08 DIAGNOSIS — L821 Other seborrheic keratosis: Secondary | ICD-10-CM | POA: Diagnosis not present

## 2019-07-01 ENCOUNTER — Ambulatory Visit (INDEPENDENT_AMBULATORY_CARE_PROVIDER_SITE_OTHER): Payer: BC Managed Care – PPO | Admitting: *Deleted

## 2019-07-01 ENCOUNTER — Other Ambulatory Visit: Payer: Self-pay

## 2019-07-01 DIAGNOSIS — Z23 Encounter for immunization: Secondary | ICD-10-CM

## 2019-08-21 DIAGNOSIS — M7711 Lateral epicondylitis, right elbow: Secondary | ICD-10-CM | POA: Diagnosis not present

## 2019-08-24 DIAGNOSIS — M7711 Lateral epicondylitis, right elbow: Secondary | ICD-10-CM | POA: Diagnosis not present

## 2019-08-24 DIAGNOSIS — M6289 Other specified disorders of muscle: Secondary | ICD-10-CM | POA: Diagnosis not present

## 2019-08-24 DIAGNOSIS — M25621 Stiffness of right elbow, not elsewhere classified: Secondary | ICD-10-CM | POA: Diagnosis not present

## 2019-08-24 DIAGNOSIS — M62838 Other muscle spasm: Secondary | ICD-10-CM | POA: Diagnosis not present

## 2019-09-01 DIAGNOSIS — M62838 Other muscle spasm: Secondary | ICD-10-CM | POA: Diagnosis not present

## 2019-09-01 DIAGNOSIS — M6289 Other specified disorders of muscle: Secondary | ICD-10-CM | POA: Diagnosis not present

## 2019-09-01 DIAGNOSIS — M7711 Lateral epicondylitis, right elbow: Secondary | ICD-10-CM | POA: Diagnosis not present

## 2019-09-01 DIAGNOSIS — M25621 Stiffness of right elbow, not elsewhere classified: Secondary | ICD-10-CM | POA: Diagnosis not present

## 2019-09-11 DIAGNOSIS — M6289 Other specified disorders of muscle: Secondary | ICD-10-CM | POA: Diagnosis not present

## 2019-09-11 DIAGNOSIS — M25621 Stiffness of right elbow, not elsewhere classified: Secondary | ICD-10-CM | POA: Diagnosis not present

## 2019-09-11 DIAGNOSIS — M7711 Lateral epicondylitis, right elbow: Secondary | ICD-10-CM | POA: Diagnosis not present

## 2019-09-11 DIAGNOSIS — M62838 Other muscle spasm: Secondary | ICD-10-CM | POA: Diagnosis not present

## 2019-09-23 DIAGNOSIS — M7711 Lateral epicondylitis, right elbow: Secondary | ICD-10-CM | POA: Diagnosis not present

## 2019-11-09 ENCOUNTER — Other Ambulatory Visit: Payer: Self-pay | Admitting: Family Medicine

## 2019-11-09 DIAGNOSIS — F329 Major depressive disorder, single episode, unspecified: Secondary | ICD-10-CM

## 2020-01-04 ENCOUNTER — Encounter: Payer: Self-pay | Admitting: Family Medicine

## 2020-01-04 ENCOUNTER — Other Ambulatory Visit: Payer: Self-pay

## 2020-01-04 ENCOUNTER — Ambulatory Visit (INDEPENDENT_AMBULATORY_CARE_PROVIDER_SITE_OTHER): Payer: BC Managed Care – PPO | Admitting: Family Medicine

## 2020-01-04 VITALS — BP 110/70 | HR 77 | Temp 98.3°F | Ht 72.0 in | Wt 181.5 lb

## 2020-01-04 DIAGNOSIS — Z0000 Encounter for general adult medical examination without abnormal findings: Secondary | ICD-10-CM

## 2020-01-04 DIAGNOSIS — Z13220 Encounter for screening for lipoid disorders: Secondary | ICD-10-CM | POA: Diagnosis not present

## 2020-01-04 DIAGNOSIS — Z131 Encounter for screening for diabetes mellitus: Secondary | ICD-10-CM

## 2020-01-04 DIAGNOSIS — H6123 Impacted cerumen, bilateral: Secondary | ICD-10-CM

## 2020-01-04 DIAGNOSIS — Z23 Encounter for immunization: Secondary | ICD-10-CM | POA: Diagnosis not present

## 2020-01-04 NOTE — Addendum Note (Signed)
Addended by: Agnes Lawrence on: 01/04/2020 09:02 AM   Modules accepted: Orders

## 2020-01-04 NOTE — Addendum Note (Signed)
Addended by: Caren Macadam on: 01/04/2020 09:11 AM   Modules accepted: Orders

## 2020-01-04 NOTE — Progress Notes (Addendum)
Vincent [name] DOB: 1960-08-26 Encounter date: 01/04/2020  This is a 59 y.o. male who presents for complete physical   History of present illness/Additional concerns: Last visit was 1 year ago for annual physical.  History of mild depression: Celexa 20 mg. Mood has been good. Sometimes takes just half a pill.   Colonoscopy 2015, 10-year repeat suggested  Follows regularly with dermatology-  Delman Cheadle. Goes in Feb.   *Shingles  - will complete today.  Past Medical History:  Diagnosis Date  . Anxiety   . Mild depression ([place])   . Skin cancer    right shoulder   Past Surgical History:  Procedure Laterality Date  . CYST REMOVAL HAND     left hand/benign  . ORIF HUMERUS FRACTURE Right 02/25/2014   Procedure: OPEN REDUCTION INTERNAL FIXATION (ORIF) PROXIMAL HUMERUS FRACTURE;  Surgeon: [name], MD;  Location: [place];  Service: Orthopedics;  Laterality: Right;  . RADIAL HEAD ARTHROPLASTY Right 02/25/2014   Procedure: RADIAL HEAD ARTHROPLASTY;  Surgeon: [name], MD;  Location: [place];  Service: Orthopedics;  Laterality: Right;  . SKIN BIOPSY    . SKIN CANCER EXCISION     right shoulder, removed by Dr Tonia Brooms   No Known Allergies Current Meds  Medication Sig  . aspirin EC 325 MG EC tablet Take 1 tablet (325 mg total) by mouth 2 (two) times daily.  . citalopram (CELEXA) 20 MG tablet TAKE 1 TABLET DAILY  . Multiple Vitamins-Minerals (ONE-A-DAY 50 PLUS PO) Take by mouth daily.   Social History   Tobacco Use  . Smoking status: Never Smoker  . Smokeless tobacco: Never Used  Substance Use Topics  . Alcohol use: Yes    Alcohol/week: 7.0 standard drinks    Types: 7 Glasses of wine per week   Family History  Problem Relation Age of Onset  . Stroke Mother   . Diverticulitis Brother   . Hernia Brother   . Skin cancer Father   . Other Father        fall with head injury-ended up in hospital  . Migraines Sister   . Colon cancer Neg Hx   . Rectal cancer  Neg Hx   . Stomach cancer Neg Hx      Review of Systems  Constitutional: Negative for activity change, appetite change, chills, fatigue, fever and unexpected weight change.  HENT: Negative for congestion, ear pain, hearing loss, sinus pressure, sinus pain, sore throat and trouble swallowing.   Eyes: Negative for pain and visual disturbance.  Respiratory: Negative for cough, chest tightness, shortness of breath and wheezing.   Cardiovascular: Negative for chest pain, palpitations and leg swelling.  Gastrointestinal: Negative for abdominal distention, abdominal pain, blood in stool, constipation, diarrhea, nausea and vomiting.  Genitourinary: Negative for decreased urine volume, difficulty urinating, dysuria, penile pain and testicular pain.  Musculoskeletal: Negative for arthralgias, back pain and joint swelling.  Skin: Negative for rash.  Neurological: Negative for dizziness, weakness, numbness and headaches.  Hematological: Negative for adenopathy. Does not bruise/bleed easily.  Psychiatric/Behavioral: Negative for agitation, sleep disturbance and suicidal ideas. The patient is not nervous/anxious.     CBC:  Lab Results  Component Value Date   WBC 5.7 12/24/2018   HGB 15.0 12/24/2018   HCT 44.3 12/24/2018   MCH 31.5 02/26/2014   MCHC 33.8 12/24/2018   RDW 13.4 12/24/2018   PLT 249.0 12/24/2018   CMP: Lab Results  Component Value Date   NA 138 12/24/2018   K 4.2 12/24/2018  CL 103 12/24/2018   CO2 26 12/24/2018   ANIONGAP 11 02/26/2014   GLUCOSE 87 12/24/2018   BUN 21 12/24/2018   CREATININE 1.05 12/24/2018   GFRAA >90 02/26/2014   CALCIUM 9.4 12/24/2018   PROT 6.8 12/24/2018   BILITOT 0.9 12/24/2018   ALKPHOS 47 12/24/2018   ALT 23 12/24/2018   AST 20 12/24/2018   LIPID: Lab Results  Component Value Date   CHOL 211 (H) 12/24/2018   TRIG 149.0 12/24/2018   HDL 60.20 12/24/2018   LDLCALC 121 (H) 12/24/2018    Objective:  BP 110/70 (BP Location: Left Arm,  Patient Position: Sitting, Cuff Size: Normal)   Pulse 77   Temp 98.3 F (36.8 C) (Oral)   Ht 6' (1.829 m)   Wt 181 lb 8 oz (82.3 kg)   BMI 24.62 kg/m   Weight: 181 lb 8 oz (82.3 kg)   BP Readings from Last 3 Encounters:  01/04/20 110/70  12/31/18 110/80  12/10/16 (!) 122/94   Wt Readings from Last 3 Encounters:  01/04/20 181 lb 8 oz (82.3 kg)  12/31/18 185 lb (83.9 kg)  11/14/18 181 lb (82.1 kg)    Physical Exam Constitutional:      General: He is not in acute distress.    Appearance: He is well-developed.  HENT:     Head: Normocephalic and atraumatic.     Right Ear: Tympanic membrane and external ear normal. There is impacted cerumen.     Left Ear: Tympanic membrane and external ear normal. There is impacted cerumen.     Nose: Nose normal.     Mouth/Throat:     Pharynx: No oropharyngeal exudate.  Eyes:     Conjunctiva/sclera: Conjunctivae normal.     Pupils: Pupils are equal, round, and reactive to light.  Neck:     Thyroid: No thyromegaly.  Cardiovascular:     Rate and Rhythm: Normal rate and regular rhythm.     Heart sounds: Normal heart sounds. No murmur heard.  No friction rub. No gallop.   Pulmonary:     Effort: Pulmonary effort is normal. No respiratory distress.     Breath sounds: Normal breath sounds. No stridor. No wheezing or rales.  Abdominal:     General: Bowel sounds are normal.     Palpations: Abdomen is soft.  Musculoskeletal:        General: Normal range of motion.     Cervical back: Neck supple.  Skin:    General: Skin is warm and dry.  Neurological:     Mental Status: He is alert and oriented to person, place, and time.  Psychiatric:        Behavior: Behavior normal.        Thought Content: Thought content normal.        Judgment: Judgment normal.   Cerumen impaction procedure: After consent was obtained -  Bilat ears irrigated without resolution of impaction; patient tolerated well. Then lighted curette was used to gradually removed  dried cerumen from ear canal. Patient tolerated procedure well bilaterally and impaction was resolved.   Assessment/Plan: There are no preventive care reminders to display for this patient. Health Maintenance reviewed - he is up to date with immunizations since we will give flu, shingles today..  1. Preventative health care Keep up with exercise, healthy eating.  - Hepatitis C antibody; Future - PSA; Future  2. Lipid screening - Lipid panel; Future  3. Screening for diabetes mellitus - Comprehensive metabolic panel; Future  4.  Cerumen impaction Resolved in office; see note above  Return in about 1 year (around 01/03/2021).  Micheline Rough, MD

## 2020-01-05 LAB — COMPREHENSIVE METABOLIC PANEL
AG Ratio: 1.8 (calc) (ref 1.0–2.5)
ALT: 15 U/L (ref 9–46)
AST: 18 U/L (ref 10–35)
Albumin: 4.4 g/dL (ref 3.6–5.1)
Alkaline phosphatase (APISO): 41 U/L (ref 35–144)
BUN: 15 mg/dL (ref 7–25)
CO2: 25 mmol/L (ref 20–32)
Calcium: 9.6 mg/dL (ref 8.6–10.3)
Chloride: 104 mmol/L (ref 98–110)
Creat: 0.94 mg/dL (ref 0.70–1.33)
Globulin: 2.4 g/dL (calc) (ref 1.9–3.7)
Glucose, Bld: 90 mg/dL (ref 65–99)
Potassium: 4.6 mmol/L (ref 3.5–5.3)
Sodium: 139 mmol/L (ref 135–146)
Total Bilirubin: 1 mg/dL (ref 0.2–1.2)
Total Protein: 6.8 g/dL (ref 6.1–8.1)

## 2020-01-05 LAB — LIPID PANEL
Cholesterol: 195 mg/dL (ref <200)
HDL: 70 mg/dL (ref >=40)
LDL Cholesterol (Calc): 104 mg/dL (calc) — ABNORMAL HIGH
Non-HDL Cholesterol (Calc): 125 mg/dL (calc) (ref <130)
Total CHOL/HDL Ratio: 2.8 (calc) (ref <5.0)
Triglycerides: 118 mg/dL (ref <150)

## 2020-01-05 LAB — PSA: PSA: 1.2 ng/mL (ref <=4.0)

## 2020-01-05 LAB — HEPATITIS C ANTIBODY
Hepatitis C Ab: NONREACTIVE
SIGNAL TO CUT-OFF: 0.02 (ref <1.00)

## 2020-01-06 ENCOUNTER — Other Ambulatory Visit: Payer: Self-pay | Admitting: Family Medicine

## 2020-01-06 DIAGNOSIS — F329 Major depressive disorder, single episode, unspecified: Secondary | ICD-10-CM

## 2020-01-06 MED ORDER — CITALOPRAM HYDROBROMIDE 20 MG PO TABS: 20.0000 mg | ORAL_TABLET | Freq: Every day | ORAL | 1 refills | Status: DC

## 2020-04-28 DIAGNOSIS — D044 Carcinoma in situ of skin of scalp and neck: Secondary | ICD-10-CM | POA: Diagnosis not present

## 2020-04-28 DIAGNOSIS — D485 Neoplasm of uncertain behavior of skin: Secondary | ICD-10-CM | POA: Diagnosis not present

## 2020-04-28 DIAGNOSIS — D2272 Melanocytic nevi of left lower limb, including hip: Secondary | ICD-10-CM | POA: Diagnosis not present

## 2020-04-28 DIAGNOSIS — L578 Other skin changes due to chronic exposure to nonionizing radiation: Secondary | ICD-10-CM | POA: Diagnosis not present

## 2020-04-28 DIAGNOSIS — L821 Other seborrheic keratosis: Secondary | ICD-10-CM | POA: Diagnosis not present

## 2020-04-28 DIAGNOSIS — L219 Seborrheic dermatitis, unspecified: Secondary | ICD-10-CM | POA: Diagnosis not present

## 2020-04-28 DIAGNOSIS — L570 Actinic keratosis: Secondary | ICD-10-CM | POA: Diagnosis not present

## 2020-06-28 DIAGNOSIS — D044 Carcinoma in situ of skin of scalp and neck: Secondary | ICD-10-CM | POA: Diagnosis not present

## 2020-07-13 ENCOUNTER — Other Ambulatory Visit: Payer: Self-pay | Admitting: Family Medicine

## 2020-07-13 DIAGNOSIS — F329 Major depressive disorder, single episode, unspecified: Secondary | ICD-10-CM

## 2021-01-04 ENCOUNTER — Other Ambulatory Visit: Payer: Self-pay

## 2021-01-04 ENCOUNTER — Ambulatory Visit (INDEPENDENT_AMBULATORY_CARE_PROVIDER_SITE_OTHER): Payer: BC Managed Care – PPO | Admitting: Family Medicine

## 2021-01-04 ENCOUNTER — Encounter: Payer: Self-pay | Admitting: Family Medicine

## 2021-01-04 VITALS — BP 112/70 | HR 63 | Temp 97.9°F | Ht 72.75 in | Wt 184.9 lb

## 2021-01-04 DIAGNOSIS — F329 Major depressive disorder, single episode, unspecified: Secondary | ICD-10-CM | POA: Diagnosis not present

## 2021-01-04 DIAGNOSIS — Z131 Encounter for screening for diabetes mellitus: Secondary | ICD-10-CM | POA: Diagnosis not present

## 2021-01-04 DIAGNOSIS — Z0000 Encounter for general adult medical examination without abnormal findings: Secondary | ICD-10-CM

## 2021-01-04 DIAGNOSIS — Z13220 Encounter for screening for lipoid disorders: Secondary | ICD-10-CM

## 2021-01-04 DIAGNOSIS — R591 Generalized enlarged lymph nodes: Secondary | ICD-10-CM | POA: Diagnosis not present

## 2021-01-04 DIAGNOSIS — Z8042 Family history of malignant neoplasm of prostate: Secondary | ICD-10-CM | POA: Diagnosis not present

## 2021-01-04 DIAGNOSIS — Z23 Encounter for immunization: Secondary | ICD-10-CM

## 2021-01-04 LAB — PSA: PSA: 1.03 ng/mL (ref 0.10–4.00)

## 2021-01-04 LAB — COMPREHENSIVE METABOLIC PANEL WITH GFR
ALT: 17 U/L (ref 0–53)
AST: 19 U/L (ref 0–37)
Albumin: 4.6 g/dL (ref 3.5–5.2)
Alkaline Phosphatase: 44 U/L (ref 39–117)
BUN: 14 mg/dL (ref 6–23)
CO2: 29 meq/L (ref 19–32)
Calcium: 9.6 mg/dL (ref 8.4–10.5)
Chloride: 103 meq/L (ref 96–112)
Creatinine, Ser: 0.99 mg/dL (ref 0.40–1.50)
GFR: 84.08 mL/min
Glucose, Bld: 87 mg/dL (ref 70–99)
Potassium: 4.6 meq/L (ref 3.5–5.1)
Sodium: 139 meq/L (ref 135–145)
Total Bilirubin: 1.5 mg/dL — ABNORMAL HIGH (ref 0.2–1.2)
Total Protein: 7 g/dL (ref 6.0–8.3)

## 2021-01-04 LAB — CBC WITH DIFFERENTIAL/PLATELET
Basophils Absolute: 0 K/uL (ref 0.0–0.1)
Basophils Relative: 0.4 % (ref 0.0–3.0)
Eosinophils Absolute: 0.1 K/uL (ref 0.0–0.7)
Eosinophils Relative: 1.7 % (ref 0.0–5.0)
HCT: 45.8 % (ref 39.0–52.0)
Hemoglobin: 15.5 g/dL (ref 13.0–17.0)
Lymphocytes Relative: 33.7 % (ref 12.0–46.0)
Lymphs Abs: 1.7 K/uL (ref 0.7–4.0)
MCHC: 33.8 g/dL (ref 30.0–36.0)
MCV: 93.7 fl (ref 78.0–100.0)
Monocytes Absolute: 0.4 K/uL (ref 0.1–1.0)
Monocytes Relative: 8.1 % (ref 3.0–12.0)
Neutro Abs: 2.8 K/uL (ref 1.4–7.7)
Neutrophils Relative %: 56.1 % (ref 43.0–77.0)
Platelets: 228 K/uL (ref 150.0–400.0)
RBC: 4.89 Mil/uL (ref 4.22–5.81)
RDW: 13 % (ref 11.5–15.5)
WBC: 5 K/uL (ref 4.0–10.5)

## 2021-01-04 LAB — LIPID PANEL
Cholesterol: 199 mg/dL (ref 0–200)
HDL: 59.3 mg/dL
LDL Cholesterol: 115 mg/dL — ABNORMAL HIGH (ref 0–99)
NonHDL: 139.33
Total CHOL/HDL Ratio: 3
Triglycerides: 122 mg/dL (ref 0.0–149.0)
VLDL: 24.4 mg/dL (ref 0.0–40.0)

## 2021-01-04 MED ORDER — CITALOPRAM HYDROBROMIDE 10 MG PO TABS: 10.0000 mg | ORAL_TABLET | Freq: Every day | ORAL | 3 refills | Status: DC

## 2021-01-04 NOTE — Addendum Note (Signed)
Addended by: Agnes Lawrence on: 01/04/2021 08:52 AM   Modules accepted: Orders

## 2021-01-04 NOTE — Addendum Note (Signed)
Addended by: Amanda Cockayne on: 01/04/2021 08:43 AM   Modules accepted: Orders

## 2021-01-04 NOTE — Progress Notes (Signed)
Vincent [name] DOB: Jan 12, 1961 Encounter date: 01/04/2021  This is a 60 y.o. male who presents for complete physical   History of present illness/Additional concerns:  Last visit with me was 1 year ago. No worries for me today.   History of mild depression: Celexa 20 mg. Mood has been good. Has cut back on celexa - taking just 10mg  daily. Sometimes forgets.    Colonoscopy 2015, 10-year repeat suggested   Follows regularly with dermatology-  Delman Cheadle. Goes in Feb.   Did have 3 covid shots, not sure of dates. Will get last covid booster after getting shots here today.   Past Medical History:  Diagnosis Date   Anxiety    Mild depression    Skin cancer    right shoulder   Past Surgical History:  Procedure Laterality Date   CYST REMOVAL HAND     left hand/benign   ORIF HUMERUS FRACTURE Right 02/25/2014   Procedure: OPEN REDUCTION INTERNAL FIXATION (ORIF) PROXIMAL HUMERUS FRACTURE;  Surgeon: [name], MD;  Location: [place];  Service: Orthopedics;  Laterality: Right;   RADIAL HEAD ARTHROPLASTY Right 02/25/2014   Procedure: RADIAL HEAD ARTHROPLASTY;  Surgeon: [name], MD;  Location: [place];  Service: Orthopedics;  Laterality: Right;   SKIN BIOPSY     SKIN CANCER EXCISION     right shoulder, removed by Dr Tonia Brooms   No Known Allergies Current Meds  Medication Sig   citalopram (CELEXA) 10 MG tablet Take 1 tablet (10 mg total) by mouth daily.   Multiple Vitamins-Minerals (ONE-A-DAY 50 PLUS PO) Take by mouth daily.   [DISCONTINUED] aspirin EC 325 MG EC tablet Take 1 tablet (325 mg total) by mouth 2 (two) times daily.   [DISCONTINUED] citalopram (CELEXA) 20 MG tablet TAKE 1 TABLET DAILY   Social History   Tobacco Use   Smoking status: Never   Smokeless tobacco: Never  Substance Use Topics   Alcohol use: Yes    Alcohol/week: 7.0 standard drinks    Types: 7 Glasses of wine per week   Family History  Problem Relation Age of Onset   Stroke Mother     Diverticulitis Brother    Hernia Brother    Skin cancer Father    Other Father        fall with head injury-ended up in hospital   Migraines Sister    Colon cancer Neg Hx    Rectal cancer Neg Hx    Stomach cancer Neg Hx      Review of Systems  Constitutional:  Negative for activity change, appetite change, chills, fatigue, fever and unexpected weight change.  HENT:  Negative for congestion, ear pain, hearing loss, sinus pressure, sinus pain, sore throat and trouble swallowing.   Eyes:  Negative for pain and visual disturbance.  Respiratory:  Negative for cough, chest tightness, shortness of breath and wheezing.   Cardiovascular:  Negative for chest pain, palpitations and leg swelling.  Gastrointestinal:  Negative for abdominal distention, abdominal pain, blood in stool, constipation, diarrhea, nausea and vomiting.  Genitourinary:  Negative for decreased urine volume, difficulty urinating, dysuria, penile pain and testicular pain.  Musculoskeletal:  Negative for arthralgias, back pain and joint swelling.  Skin:  Negative for rash.  Neurological:  Negative for dizziness, weakness, numbness and headaches.  Hematological:  Negative for adenopathy. Does not bruise/bleed easily.  Psychiatric/Behavioral:  Negative for agitation, sleep disturbance and suicidal ideas. The patient is not nervous/anxious.    CBC:  Lab Results  Component Value Date  WBC 5.7 12/24/2018   HGB 15.0 12/24/2018   HCT 44.3 12/24/2018   MCH 31.5 02/26/2014   MCHC 33.8 12/24/2018   RDW 13.4 12/24/2018   PLT 249.0 12/24/2018   CMP: Lab Results  Component Value Date   NA 139 01/04/2020   K 4.6 01/04/2020   CL 104 01/04/2020   CO2 25 01/04/2020   ANIONGAP 11 02/26/2014   GLUCOSE 90 01/04/2020   BUN 15 01/04/2020   CREATININE 0.94 01/04/2020   GFRAA >90 02/26/2014   CALCIUM 9.6 01/04/2020   PROT 6.8 01/04/2020   BILITOT 1.0 01/04/2020   ALKPHOS 47 12/24/2018   ALT 15 01/04/2020   AST 18 01/04/2020    LIPID: Lab Results  Component Value Date   CHOL 195 01/04/2020   TRIG 118 01/04/2020   HDL 70 01/04/2020   LDLCALC 104 (H) 01/04/2020    Objective:  BP 112/70 (BP Location: Left Arm, Patient Position: Sitting, Cuff Size: Large)   Pulse 63   Temp 97.9 F (36.6 C) (Oral)   Ht 6' 0.75" (1.848 m)   Wt 184 lb 14.4 oz (83.9 kg)   SpO2 98%   BMI 24.56 kg/m   Weight: 184 lb 14.4 oz (83.9 kg)   BP Readings from Last 3 Encounters:  01/04/21 112/70  01/04/20 110/70  12/31/18 110/80   Wt Readings from Last 3 Encounters:  01/04/21 184 lb 14.4 oz (83.9 kg)  01/04/20 181 lb 8 oz (82.3 kg)  12/31/18 185 lb (83.9 kg)    Physical Exam Constitutional:      General: He is not in acute distress.    Appearance: He is well-developed.  HENT:     Head: Normocephalic and atraumatic.     Right Ear: External ear normal.     Left Ear: External ear normal.     Nose: Nose normal.     Mouth/Throat:     Pharynx: No oropharyngeal exudate.  Eyes:     Conjunctiva/sclera: Conjunctivae normal.     Pupils: Pupils are equal, round, and reactive to light.  Neck:     Thyroid: No thyroid mass, thyromegaly or thyroid tenderness.     Comments: Small 1.5cm bilat submandibular lymphadenopathy Cardiovascular:     Rate and Rhythm: Normal rate and regular rhythm.     Heart sounds: Normal heart sounds. No murmur heard.   No friction rub. No gallop.  Pulmonary:     Effort: Pulmonary effort is normal. No respiratory distress.     Breath sounds: Normal breath sounds. No stridor. No wheezing or rales.  Abdominal:     General: Bowel sounds are normal.     Palpations: Abdomen is soft.  Musculoskeletal:        General: Normal range of motion.     Cervical back: Neck supple.  Lymphadenopathy:     Head:     Right side of head: No submental adenopathy.     Left side of head: No submental adenopathy.     Cervical:     Right cervical: No superficial, deep or posterior cervical adenopathy.    Left cervical:  No superficial, deep or posterior cervical adenopathy.     Upper Body:     Right upper body: No supraclavicular adenopathy.     Left upper body: No supraclavicular adenopathy.     Lower Body: No right inguinal adenopathy. No left inguinal adenopathy.  Skin:    General: Skin is warm and dry.     Comments: Dry skin face  Neurological:  Mental Status: He is alert and oriented to person, place, and time.  Psychiatric:        Behavior: Behavior normal.        Thought Content: Thought content normal.        Judgment: Judgment normal.    Assessment/Plan: Health Maintenance Due  Topic Date Due   COVID-19 Vaccine (2 - Pfizer series) 02/08/2020   Zoster Vaccines- Shingrix (2 of 2) 02/29/2020   INFLUENZA VACCINE  10/10/2020   Health Maintenance reviewed.  1. Preventative health care Keep up with regular exercise, dermatology/dental care, healthy lifestyle. Second shingrix and flu shot today.  2. Reactive depression (situational) He is taking 10mg  and not daily now; advised ok to cut back as tolerated to lowest effective dose.  - citalopram (CELEXA) 10 MG tablet; Take 1 tablet (10 mg total) by mouth daily.  Dispense: 90 tablet; Refill: 3  3. Lipid screening - Lipid panel; Future  4. Screening for diabetes mellitus - Comprehensive metabolic panel; Future  5. Family history of prostate cancer  - PSA; Future  6. Lymphadenopathy - CBC with Differential/Platelet; Future   Return in about 1 year (around 01/04/2022).  Micheline Rough, MD

## 2021-01-09 NOTE — Addendum Note (Signed)
Addended by: Agnes Lawrence on: 01/09/2021 04:48 PM   Modules accepted: Orders

## 2021-04-11 ENCOUNTER — Other Ambulatory Visit (INDEPENDENT_AMBULATORY_CARE_PROVIDER_SITE_OTHER): Payer: BC Managed Care – PPO

## 2021-04-11 LAB — COMPREHENSIVE METABOLIC PANEL
ALT: 17 U/L (ref 0–53)
AST: 18 U/L (ref 0–37)
Albumin: 4.5 g/dL (ref 3.5–5.2)
Alkaline Phosphatase: 42 U/L (ref 39–117)
BUN: 14 mg/dL (ref 6–23)
CO2: 31 mEq/L (ref 19–32)
Calcium: 9.5 mg/dL (ref 8.4–10.5)
Chloride: 103 mEq/L (ref 96–112)
Creatinine, Ser: 1 mg/dL (ref 0.40–1.50)
GFR: 82.91 mL/min (ref >60.00)
Glucose, Bld: 100 mg/dL — ABNORMAL HIGH (ref 70–99)
Potassium: 4 mEq/L (ref 3.5–5.1)
Sodium: 139 mEq/L (ref 135–145)
Total Bilirubin: 1.8 mg/dL — ABNORMAL HIGH (ref 0.2–1.2)
Total Protein: 7.3 g/dL (ref 6.0–8.3)

## 2021-04-12 ENCOUNTER — Other Ambulatory Visit: Payer: BC Managed Care – PPO

## 2021-04-12 ENCOUNTER — Other Ambulatory Visit: Payer: Self-pay | Admitting: Family Medicine

## 2021-04-12 DIAGNOSIS — R17 Unspecified jaundice: Secondary | ICD-10-CM

## 2021-04-28 ENCOUNTER — Ambulatory Visit
Admission: RE | Admit: 2021-04-28 | Discharge: 2021-04-28 | Disposition: A | Discharge status: Home / Self Care | Payer: BC Managed Care – PPO | Source: Ambulatory Visit | Attending: Family Medicine | Admitting: Family Medicine

## 2021-04-28 DIAGNOSIS — L821 Other seborrheic keratosis: Secondary | ICD-10-CM | POA: Diagnosis not present

## 2021-04-28 DIAGNOSIS — K7689 Other specified diseases of liver: Secondary | ICD-10-CM | POA: Diagnosis not present

## 2021-04-28 DIAGNOSIS — L570 Actinic keratosis: Secondary | ICD-10-CM | POA: Diagnosis not present

## 2021-04-28 DIAGNOSIS — L578 Other skin changes due to chronic exposure to nonionizing radiation: Secondary | ICD-10-CM | POA: Diagnosis not present

## 2021-04-28 DIAGNOSIS — L219 Seborrheic dermatitis, unspecified: Secondary | ICD-10-CM | POA: Diagnosis not present

## 2021-04-28 DIAGNOSIS — R17 Unspecified jaundice: Secondary | ICD-10-CM

## 2021-04-28 DIAGNOSIS — L719 Rosacea, unspecified: Secondary | ICD-10-CM | POA: Diagnosis not present

## 2021-05-31 ENCOUNTER — Telehealth: Payer: Self-pay | Admitting: Family Medicine

## 2021-05-31 DIAGNOSIS — F329 Major depressive disorder, single episode, unspecified: Secondary | ICD-10-CM

## 2021-05-31 NOTE — Telephone Encounter (Signed)
Pt is calling and would like a refill on citalopram (CELEXA) 10 MG tablet send to  ?[facility], [place] Phone:  [phone]  ?Fax:  [phone]  ?  ? ?

## 2021-06-01 MED ORDER — CITALOPRAM HYDROBROMIDE 10 MG PO TABS: 10.0000 mg | ORAL_TABLET | Freq: Every day | ORAL | 2 refills | Status: DC

## 2021-06-01 NOTE — Telephone Encounter (Signed)
Rx done. 

## 2021-10-27 ENCOUNTER — Encounter: Payer: BC Managed Care – PPO | Admitting: Family Medicine

## 2021-11-02 ENCOUNTER — Encounter: Payer: Self-pay | Admitting: Family Medicine

## 2021-11-02 ENCOUNTER — Ambulatory Visit: Payer: BC Managed Care – PPO | Admitting: Family Medicine

## 2021-11-02 VITALS — BP 116/82 | HR 70 | Temp 97.7°F | Ht 72.75 in | Wt 187.5 lb

## 2021-11-02 DIAGNOSIS — K7689 Other specified diseases of liver: Secondary | ICD-10-CM | POA: Diagnosis not present

## 2021-11-02 DIAGNOSIS — L570 Actinic keratosis: Secondary | ICD-10-CM | POA: Diagnosis not present

## 2021-11-02 DIAGNOSIS — F329 Major depressive disorder, single episode, unspecified: Secondary | ICD-10-CM | POA: Diagnosis not present

## 2021-11-02 DIAGNOSIS — L821 Other seborrheic keratosis: Secondary | ICD-10-CM | POA: Insufficient documentation

## 2021-11-02 DIAGNOSIS — L719 Rosacea, unspecified: Secondary | ICD-10-CM | POA: Insufficient documentation

## 2021-11-02 NOTE — Assessment & Plan Note (Signed)
Sees dermatology regularly, will continue to monitor.

## 2021-11-02 NOTE — Assessment & Plan Note (Signed)
Patient reports good on control of his symptoms on the 10 mg daily dosing, will continue this, RTC in 6 months for his annual physical.

## 2021-11-02 NOTE — Assessment & Plan Note (Signed)
Seen on previous imaging. Recommendation from the radiologist is to perform a follow up liver US In 6 months. Will order at his next visit.

## 2021-11-02 NOTE — Progress Notes (Signed)
Established Patient Office Visit  Subjective   Patient ID: Vincent [name], male    DOB: 1960/05/27  Age: 61 y.o. MRN: [id number]  Chief Complaint  Patient presents with   Establish Care    Patient is here for transition of care visit. Patient reports he has a history of depression. States that his symptoms are well controlled with the 10 mg celexa. States he used to be on 20 mg until March 2023, states he doesn't feel a difference between the 10 mg and 20 mg. States that he has been on this medication for a few years. We discussed the possibility of weaning off the celexa and monitoring for return of depressive symptoms, pt states he will think about it.   Rosacea/ actinic keratosis-- pt is seeing his dermatologist regularly. I reviewed the last progress note. States he continues to use the creams prescribed to him.          Review of Systems  All other systems reviewed and are negative.     Objective:     BP 116/82 (BP Location: Left Arm, Patient Position: Sitting, Cuff Size: Normal)   Pulse 70   Temp 97.7 F (36.5 C) (Oral)   Ht 6' 0.75" (1.848 m)   Wt 187 lb 8 oz (85 kg)   SpO2 98%   BMI 24.91 kg/m    Physical Exam Vitals reviewed.  Constitutional:      Appearance: Normal appearance. He is well-groomed and normal weight.  HENT:     Head: Normocephalic and atraumatic.  Eyes:     Extraocular Movements: Extraocular movements intact.     Conjunctiva/sclera: Conjunctivae normal.  Cardiovascular:     Rate and Rhythm: Normal rate and regular rhythm.     Heart sounds: S1 normal and S2 normal. No murmur heard. Pulmonary:     Effort: Pulmonary effort is normal.     Breath sounds: Normal breath sounds and air entry. No rales.  Abdominal:     General: Abdomen is flat. Bowel sounds are normal.     Palpations: Abdomen is soft.     Tenderness: There is no abdominal tenderness.  Musculoskeletal:     Right lower leg: No edema.     Left lower leg: No edema.   Neurological:     General: No focal deficit present.     Mental Status: He is alert and oriented to person, place, and time.     Gait: Gait is intact.     Deep Tendon Reflexes: Reflexes are normal and symmetric.  Psychiatric:        Mood and Affect: Mood and affect normal.      No results found for any visits on 11/02/21.    The 10-year ASCVD risk score (Arnett DK, et al., 2019) is: 5.9%    Assessment & Plan:   Problem List Items Addressed This Visit       Digestive   Liver cyst (Chronic)    Seen on previous imaging. Recommendation from the radiologist is to perform a follow up liver US In 6 months. Will order at his next visit.        Musculoskeletal and Integument   Actinic keratosis (Chronic)    Sees dermatology regularly, will continue to monitor.        Other   Reactive depression (situational) - Primary    Patient reports good on control of his symptoms on the 10 mg daily dosing, will continue this, RTC in 6 months for his annual physical.  Return in about 6 months (around 05/05/2022) for Annual physical visit.    Farrel Conners, MD

## 2022-01-10 ENCOUNTER — Encounter: Payer: BC Managed Care – PPO | Admitting: Family Medicine

## 2022-02-08 ENCOUNTER — Other Ambulatory Visit: Payer: Self-pay | Admitting: *Deleted

## 2022-02-08 DIAGNOSIS — F329 Major depressive disorder, single episode, unspecified: Secondary | ICD-10-CM

## 2022-02-08 MED ORDER — CITALOPRAM HYDROBROMIDE 10 MG PO TABS: 10.0000 mg | ORAL_TABLET | Freq: Every day | ORAL | 1 refills | Status: DC

## 2022-05-01 ENCOUNTER — Ambulatory Visit (INDEPENDENT_AMBULATORY_CARE_PROVIDER_SITE_OTHER): Payer: BC Managed Care – PPO | Admitting: Family Medicine

## 2022-05-01 ENCOUNTER — Encounter: Payer: Self-pay | Admitting: Family Medicine

## 2022-05-01 VITALS — BP 110/58 | HR 60 | Temp 98.1°F | Ht 72.75 in | Wt 188.0 lb

## 2022-05-01 DIAGNOSIS — Z23 Encounter for immunization: Secondary | ICD-10-CM

## 2022-05-01 DIAGNOSIS — Z0000 Encounter for general adult medical examination without abnormal findings: Secondary | ICD-10-CM | POA: Diagnosis not present

## 2022-05-01 DIAGNOSIS — L219 Seborrheic dermatitis, unspecified: Secondary | ICD-10-CM | POA: Diagnosis not present

## 2022-05-01 DIAGNOSIS — L570 Actinic keratosis: Secondary | ICD-10-CM | POA: Diagnosis not present

## 2022-05-01 DIAGNOSIS — L821 Other seborrheic keratosis: Secondary | ICD-10-CM | POA: Diagnosis not present

## 2022-05-01 DIAGNOSIS — L578 Other skin changes due to chronic exposure to nonionizing radiation: Secondary | ICD-10-CM | POA: Diagnosis not present

## 2022-05-01 DIAGNOSIS — D225 Melanocytic nevi of trunk: Secondary | ICD-10-CM | POA: Diagnosis not present

## 2022-05-01 LAB — COMPREHENSIVE METABOLIC PANEL
ALT: 18 U/L (ref 0–53)
AST: 17 U/L (ref 0–37)
Albumin: 4.4 g/dL (ref 3.5–5.2)
Alkaline Phosphatase: 50 U/L (ref 39–117)
BUN: 15 mg/dL (ref 6–23)
CO2: 28 mEq/L (ref 19–32)
Calcium: 9.6 mg/dL (ref 8.4–10.5)
Chloride: 104 mEq/L (ref 96–112)
Creatinine, Ser: 1.03 mg/dL (ref 0.40–1.50)
GFR: 79.44 mL/min (ref >60.00)
Glucose, Bld: 94 mg/dL (ref 70–99)
Potassium: 4.1 mEq/L (ref 3.5–5.1)
Sodium: 140 mEq/L (ref 135–145)
Total Bilirubin: 1.9 mg/dL — ABNORMAL HIGH (ref 0.2–1.2)
Total Protein: 7.2 g/dL (ref 6.0–8.3)

## 2022-05-01 LAB — LIPID PANEL
Cholesterol: 161 mg/dL (ref 0–200)
HDL: 53.5 mg/dL (ref >39.00)
LDL Cholesterol: 80 mg/dL (ref 0–99)
NonHDL: 107.98
Total CHOL/HDL Ratio: 3
Triglycerides: 138 mg/dL (ref 0.0–149.0)
VLDL: 27.6 mg/dL (ref 0.0–40.0)

## 2022-05-01 NOTE — Progress Notes (Signed)
Complete physical exam  Patient: Vincent [name]   DOB: 06/14/1960   62 y.o. Male  MRN: [id number]  Subjective:    Chief Complaint  Patient presents with   Annual Exam    Vincent [name] is a 62 y.o. male who presents today for a complete physical exam. He reports consuming a general diet. Gym/ health club routine includes jogging on track . He generally feels well. He reports sleeping well. He does not have additional problems to discuss today.    Most recent fall risk assessment:     No data to display           Most recent depression screenings:    05/01/2022    7:57 AM 11/02/2021   11:04 AM  PHQ 2/9 Scores  PHQ - 2 Score 0 0  PHQ- 9 Score 0 0    Vision:Within last year and Dental: No current dental problems and Receives regular dental care  Patient Active Problem List   Diagnosis Date Noted   Actinic keratosis 11/02/2021   Rosacea 11/02/2021   Liver cyst 11/02/2021   Seborrheic keratosis 11/02/2021   Reactive depression (situational) 01/17/2012      Patient Care Team: Farrel Conners, MD as PCP - General (Family Medicine) Jari Pigg, MD as Consulting Physician (Dermatology)   Outpatient Medications Prior to Visit  Medication Sig   citalopram (CELEXA) 10 MG tablet Take 1 tablet (10 mg total) by mouth daily.   Multiple Vitamins-Minerals (ONE-A-DAY 50 PLUS PO) Take by mouth daily.   No facility-administered medications prior to visit.    Review of Systems  HENT:  Negative for hearing loss.   Eyes:  Negative for blurred vision.  Respiratory:  Negative for shortness of breath.   Cardiovascular:  Negative for chest pain.  Gastrointestinal: Negative.   Genitourinary: Negative.   Musculoskeletal:  Negative for back pain.  Neurological:  Negative for headaches.  Psychiatric/Behavioral:  Negative for depression.           Objective:     BP (!) 110/58 (BP Location: Left Arm, Patient Position: Sitting, Cuff Size: Normal)   Pulse 60   Temp 98.1 F  (36.7 C) (Oral)   Ht 6' 0.75" (1.848 m)   Wt 188 lb (85.3 kg)   SpO2 98%   BMI 24.97 kg/m  BP Readings from Last 3 Encounters:  05/01/22 (!) 110/58  11/02/21 116/82  01/04/21 112/70      Physical Exam Vitals reviewed.  Constitutional:      Appearance: Normal appearance. He is well-groomed and normal weight.  HENT:     Head: Normocephalic and atraumatic.     Right Ear: Tympanic membrane normal.     Left Ear: Tympanic membrane normal.     Ears:     Comments: Mild wax buildup in left EAC    Mouth/Throat:     Mouth: Mucous membranes are moist.     Pharynx: No posterior oropharyngeal erythema.  Eyes:     Extraocular Movements: Extraocular movements intact.     Conjunctiva/sclera: Conjunctivae normal.     Pupils: Pupils are equal, round, and reactive to light.  Neck:     Thyroid: No thyromegaly.  Cardiovascular:     Rate and Rhythm: Normal rate and regular rhythm.     Pulses: Normal pulses.     Heart sounds: S1 normal and S2 normal. No murmur heard. Pulmonary:     Effort: Pulmonary effort is normal.     Breath sounds: Normal breath sounds and  air entry. No rales.  Abdominal:     General: Abdomen is flat. Bowel sounds are normal.     Palpations: Abdomen is soft.     Tenderness: There is no abdominal tenderness.  Musculoskeletal:     Right lower leg: No edema.     Left lower leg: No edema.  Lymphadenopathy:     Cervical: No cervical adenopathy.  Neurological:     General: No focal deficit present.     Mental Status: He is alert and oriented to person, place, and time.     Gait: Gait is intact.     Deep Tendon Reflexes: Reflexes are normal and symmetric.  Psychiatric:        Mood and Affect: Mood and affect normal.        Behavior: Behavior normal.      No results found for any visits on 05/01/22.     Assessment & Plan:    Routine Health Maintenance and Physical Exam  Immunization History  Administered Date(s) Administered   Influenza Split 11/22/2011,  12/10/2012   Influenza,inj,Quad PF,6+ Mos 12/31/2018, 01/04/2020, 01/04/2021, 05/01/2022   Influenza-Unspecified 12/10/2013, 01/11/2015   PFIZER(Purple Top)SARS-COV-2 Vaccination 01/18/2020, 05/10/2020   Td 06/16/2009   Tdap 07/01/2019   Zoster Recombinat (Shingrix) 01/04/2020, 01/04/2021    Health Maintenance  Topic Date Due   COVID-19 Vaccine (3 - Pfizer risk series) 05/17/2022 (Originally 06/07/2020)   COLONOSCOPY (Pts 45-24yr Insurance coverage will need to be confirmed)  05/06/2023   DTaP/Tdap/Td (3 - Td or Tdap) 06/30/2029   INFLUENZA VACCINE  Completed   Hepatitis C Screening  Completed   HIV Screening  Completed   Zoster Vaccines- Shingrix  Completed   HPV VACCINES  Aged Out    Discussed health benefits of physical activity, and encouraged him to engage in regular exercise appropriate for his age and condition.  Problem List Items Addressed This Visit   None Visit Diagnoses     Routine general medical examination at a health care facility    -  Primary   Relevant Orders   Comprehensive metabolic panel   Lipid panel  Normal physical exam findings, recommended Debrox ear drops OTC to help control wax in the ears. RTC 1 year for annual physical. Flu shot given today also.     Return in 1 year (on 05/02/2023) for annual physical exam.     BFarrel Conners MD

## 2022-05-01 NOTE — Patient Instructions (Signed)
Health Maintenance, Male Adopting a healthy lifestyle and getting preventive care are important in promoting health and wellness. Ask your health care provider about: The right schedule for you to have regular tests and exams. Things you can do on your own to prevent diseases and keep yourself healthy. What should I know about diet, weight, and exercise? Eat a healthy diet  Eat a diet that includes plenty of vegetables, fruits, low-fat dairy products, and lean protein. Do not eat a lot of foods that are high in solid fats, added sugars, or sodium. Maintain a healthy weight Body mass index (BMI) is a measurement that can be used to identify possible weight problems. It estimates body fat based on height and weight. Your health care provider can help determine your BMI and help you achieve or maintain a healthy weight. Get regular exercise Get regular exercise. This is one of the most important things you can do for your health. Most adults should: Exercise for at least 150 minutes each week. The exercise should increase your heart rate and make you sweat (moderate-intensity exercise). Do strengthening exercises at least twice a week. This is in addition to the moderate-intensity exercise. Spend less time sitting. Even light physical activity can be beneficial. Watch cholesterol and blood lipids Have your blood tested for lipids and cholesterol at 60 years of age, then have this test every 5 years. You may need to have your cholesterol levels checked more often if: Your lipid or cholesterol levels are high. You are older than 60 years of age. You are at high risk for heart disease. What should I know about cancer screening? Many types of cancers can be detected early and may often be prevented. Depending on your health history and family history, you may need to have cancer screening at various ages. This may include screening for: Colorectal cancer. Prostate cancer. Skin cancer. Lung  cancer. What should I know about heart disease, diabetes, and high blood pressure? Blood pressure and heart disease High blood pressure causes heart disease and increases the risk of stroke. This is more likely to develop in people who have high blood pressure readings or are overweight. Talk with your health care provider about your target blood pressure readings. Have your blood pressure checked: Every 3-5 years if you are 18-39 years of age. Every year if you are 40 years old or older. If you are between the ages of 65 and 75 and are a current or former smoker, ask your health care provider if you should have a one-time screening for abdominal aortic aneurysm (AAA). Diabetes Have regular diabetes screenings. This checks your fasting blood sugar level. Have the screening done: Once every three years after age 45 if you are at a normal weight and have a low risk for diabetes. More often and at a younger age if you are overweight or have a high risk for diabetes. What should I know about preventing infection? Hepatitis B If you have a higher risk for hepatitis B, you should be screened for this virus. Talk with your health care provider to find out if you are at risk for hepatitis B infection. Hepatitis C Blood testing is recommended for: Everyone born from 1945 through 1965. Anyone with known risk factors for hepatitis C. Sexually transmitted infections (STIs) You should be screened each year for STIs, including gonorrhea and chlamydia, if: You are sexually active and are younger than 60 years of age. You are older than 60 years of age and your   health care provider tells you that you are at risk for this type of infection. Your sexual activity has changed since you were last screened, and you are at increased risk for chlamydia or gonorrhea. Ask your health care provider if you are at risk. Ask your health care provider about whether you are at high risk for HIV. Your health care provider  may recommend a prescription medicine to help prevent HIV infection. If you choose to take medicine to prevent HIV, you should first get tested for HIV. You should then be tested every 3 months for as long as you are taking the medicine. Follow these instructions at home: Alcohol use Do not drink alcohol if your health care provider tells you not to drink. If you drink alcohol: Limit how much you have to 0-2 drinks a day. Know how much alcohol is in your drink. In the [place] one drink equals one 12 oz bottle of beer (355 mL), one 5 oz glass of wine (148 mL), or one 1 oz glass of hard liquor (44 mL). Lifestyle Do not use any products that contain nicotine or tobacco. These products include cigarettes, chewing tobacco, and vaping devices, such as e-cigarettes. If you need help quitting, ask your health care provider. Do not use street drugs. Do not share needles. Ask your health care provider for help if you need support or information about quitting drugs. General instructions Schedule regular health, dental, and eye exams. Stay current with your vaccines. Tell your health care provider if: You often feel depressed. You have ever been abused or do not feel safe at home. Summary Adopting a healthy lifestyle and getting preventive care are important in promoting health and wellness. Follow your health care provider's instructions about healthy diet, exercising, and getting tested or screened for diseases. Follow your health care provider's instructions on monitoring your cholesterol and blood pressure. This information is not intended to replace advice given to you by your health care provider. Make sure you discuss any questions you have with your health care provider. Document Revised: 07/18/2020 Document Reviewed: 07/18/2020 Elsevier Patient Education  2023 [employer]  

## 2022-07-03 DIAGNOSIS — L298 Other pruritus: Secondary | ICD-10-CM | POA: Diagnosis not present

## 2022-07-03 DIAGNOSIS — L239 Allergic contact dermatitis, unspecified cause: Secondary | ICD-10-CM | POA: Diagnosis not present

## 2022-08-07 ENCOUNTER — Other Ambulatory Visit: Payer: Self-pay | Admitting: Family Medicine

## 2022-08-07 DIAGNOSIS — F329 Major depressive disorder, single episode, unspecified: Secondary | ICD-10-CM

## 2023-05-03 ENCOUNTER — Encounter: Payer: Self-pay | Admitting: Family Medicine

## 2023-05-03 ENCOUNTER — Ambulatory Visit (INDEPENDENT_AMBULATORY_CARE_PROVIDER_SITE_OTHER): Payer: BC Managed Care – PPO | Admitting: Family Medicine

## 2023-05-03 VITALS — BP 114/77 | HR 72 | Temp 98.0°F | Ht 72.0 in | Wt 193.4 lb

## 2023-05-03 DIAGNOSIS — Z1211 Encounter for screening for malignant neoplasm of colon: Secondary | ICD-10-CM | POA: Diagnosis not present

## 2023-05-03 DIAGNOSIS — Z0000 Encounter for general adult medical examination without abnormal findings: Secondary | ICD-10-CM

## 2023-05-03 DIAGNOSIS — Z13220 Encounter for screening for lipoid disorders: Secondary | ICD-10-CM

## 2023-05-03 DIAGNOSIS — Z125 Encounter for screening for malignant neoplasm of prostate: Secondary | ICD-10-CM | POA: Diagnosis not present

## 2023-05-03 DIAGNOSIS — F329 Major depressive disorder, single episode, unspecified: Secondary | ICD-10-CM

## 2023-05-03 LAB — CBC WITH DIFFERENTIAL/PLATELET
Basophils Absolute: 0 10*3/uL (ref 0.0–0.1)
Basophils Relative: 0.3 % (ref 0.0–3.0)
Eosinophils Absolute: 0.1 10*3/uL (ref 0.0–0.7)
Eosinophils Relative: 1.6 % (ref 0.0–5.0)
HCT: 46.9 % (ref 39.0–52.0)
Hemoglobin: 15.8 g/dL (ref 13.0–17.0)
Lymphocytes Relative: 30.9 % (ref 12.0–46.0)
Lymphs Abs: 2.3 10*3/uL (ref 0.7–4.0)
MCHC: 33.7 g/dL (ref 30.0–36.0)
MCV: 93.9 fL (ref 78.0–100.0)
Monocytes Absolute: 0.6 10*3/uL (ref 0.1–1.0)
Monocytes Relative: 8.2 % (ref 3.0–12.0)
Neutro Abs: 4.4 10*3/uL (ref 1.4–7.7)
Neutrophils Relative %: 59 % (ref 43.0–77.0)
Platelets: 241 10*3/uL (ref 150.0–400.0)
RBC: 5 Mil/uL (ref 4.22–5.81)
RDW: 12.9 % (ref 11.5–15.5)
WBC: 7.5 10*3/uL (ref 4.0–10.5)

## 2023-05-03 LAB — COMPREHENSIVE METABOLIC PANEL
ALT: 18 U/L (ref 0–53)
AST: 16 U/L (ref 0–37)
Albumin: 4.5 g/dL (ref 3.5–5.2)
Alkaline Phosphatase: 49 U/L (ref 39–117)
BUN: 18 mg/dL (ref 6–23)
CO2: 30 meq/L (ref 19–32)
Calcium: 9.3 mg/dL (ref 8.4–10.5)
Chloride: 102 meq/L (ref 96–112)
Creatinine, Ser: 0.99 mg/dL (ref 0.40–1.50)
GFR: 82.72 mL/min (ref >60.00)
Glucose, Bld: 91 mg/dL (ref 70–99)
Potassium: 4.7 meq/L (ref 3.5–5.1)
Sodium: 139 meq/L (ref 135–145)
Total Bilirubin: 1.1 mg/dL (ref 0.2–1.2)
Total Protein: 7.2 g/dL (ref 6.0–8.3)

## 2023-05-03 LAB — LIPID PANEL
Cholesterol: 188 mg/dL (ref 0–200)
HDL: 63.4 mg/dL (ref >39.00)
LDL Cholesterol: 105 mg/dL — ABNORMAL HIGH (ref 0–99)
NonHDL: 124.51
Total CHOL/HDL Ratio: 3
Triglycerides: 97 mg/dL (ref 0.0–149.0)
VLDL: 19.4 mg/dL (ref 0.0–40.0)

## 2023-05-03 LAB — PSA: PSA: 1.79 ng/mL (ref 0.10–4.00)

## 2023-05-03 MED ORDER — CITALOPRAM HYDROBROMIDE 10 MG PO TABS: 10.0000 mg | ORAL_TABLET | Freq: Every day | ORAL | 2 refills | Status: DC

## 2023-05-03 NOTE — Progress Notes (Signed)
Complete physical exam  Patient: Vincent [name]   DOB: February 01, 1961   63 y.o. Male  MRN: [id number]  Subjective:    Chief Complaint  Patient presents with   Annual Exam    CPE. Pt is fasting. No concerns.     Drue Harr is a 63 y.o. male who presents today for a complete physical exam. He reports consuming a general diet. Gym/ health club routine includes jogging 2-3 times per week. He generally feels well. He reports sleeping well. He does not have additional problems to discuss today.    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   [DISCONTINUED] citalopram (CELEXA) 10 MG tablet TAKE 1 TABLET DAILY   No facility-administered medications prior to visit.    Review of Systems  HENT:  Negative for hearing loss.   Eyes:  Negative for blurred vision.  Respiratory:  Negative for shortness of breath.   Cardiovascular:  Negative for chest pain.  Gastrointestinal: Negative.   Genitourinary: Negative.   Musculoskeletal:  Negative for back pain.  Neurological:  Negative for headaches.  Psychiatric/Behavioral:  Negative for depression.   All other systems reviewed and are negative.      Objective:     BP 114/77 (BP  Location: Right Arm, Patient Position: Sitting, Cuff Size: Normal)   Pulse 72   Temp 98 F (36.7 C)   Ht 6' (1.829 m)   Wt 193 lb 6.4 oz (87.7 kg)   SpO2 98%   BMI 26.23 kg/m    Physical Exam Vitals reviewed.  Constitutional:      Appearance: Normal appearance. He is well-groomed and normal weight.  HENT:     Right Ear: Tympanic membrane and ear canal normal.     Left Ear: Tympanic membrane and ear canal normal.     Mouth/Throat:     Mouth: Mucous membranes are moist.     Pharynx: No posterior oropharyngeal erythema.  Eyes:     Extraocular Movements: Extraocular movements intact.     Conjunctiva/sclera: Conjunctivae normal.  Neck:     Thyroid: No thyromegaly.  Cardiovascular:     Rate and Rhythm: Normal rate and regular rhythm.     Heart sounds: S1 normal and S2 normal. No murmur heard. Pulmonary:     Effort: Pulmonary effort is normal.     Breath sounds: Normal breath sounds and air entry. No rales.  Abdominal:     General: Abdomen is flat. Bowel sounds are normal.  Musculoskeletal:     Right lower leg: No edema.     Left lower leg: No edema.  Lymphadenopathy:  Cervical: No cervical adenopathy.  Neurological:     General: No focal deficit present.     Mental Status: He is alert and oriented to person, place, and time.     Gait: Gait is intact.  Psychiatric:        Mood and Affect: Mood and affect normal.      No results found for any visits on 05/03/23.     Assessment & Plan:    Routine Health Maintenance and Physical Exam  Immunization History  Administered Date(s) Administered   Influenza Split 11/22/2011, 12/10/2012   Influenza,inj,Quad PF,6+ Mos 12/31/2018, 01/04/2020, 01/04/2021, 05/01/2022   Influenza-Unspecified 12/10/2013, 01/11/2015   PFIZER(Purple Top)SARS-COV-2 Vaccination 01/18/2020, 05/10/2020   Td 06/16/2009   Tdap 07/01/2019   Zoster Recombinant(Shingrix) 01/04/2020, 01/04/2021    Health Maintenance  Topic Date Due   COVID-19  Vaccine (3 - Pfizer risk series) 06/07/2020   INFLUENZA VACCINE  10/11/2022   Colonoscopy  05/06/2023   DTaP/Tdap/Td (3 - Td or Tdap) 06/30/2029   Hepatitis C Screening  Completed   HIV Screening  Completed   Zoster Vaccines- Shingrix  Completed   HPV VACCINES  Aged Out    Discussed health benefits of physical activity, and encouraged him to engage in regular exercise appropriate for his age and condition.  Routine general medical examination at a health care facility -     CBC with Differential/Platelet; Future -     Comprehensive metabolic panel; Future  Prostate cancer screening -     PSA; Future  Lipid screening -     Lipid panel; Future  Colon cancer screening -     Ambulatory referral to Gastroenterology  Reactive depression (situational) -     Citalopram Hydrobromide; Take 1 tablet (10 mg total) by mouth daily.  Dispense: 90 tablet; Refill: 2  Normal physical exam findings today, he did have some wax buildup in the ear canals BL so I gave him counseling on managing ear wax and recommended OTC debrox solution. Handouts given on healthy eating and exercise, labs ordered for annual surveillance. RTC yearly   Return in 1 year (on 05/02/2024).     Karie Georges, MD

## 2023-05-03 NOTE — Patient Instructions (Addendum)
Debrox ear drops-- 2-3 drops twice a week in each ear  Health Maintenance, Male Adopting a healthy lifestyle and getting preventive care are important in promoting health and wellness. Ask your health care provider about: The right schedule for you to have regular tests and exams. Things you can do on your own to prevent diseases and keep yourself healthy. What should I know about diet, weight, and exercise? Eat a healthy diet  Eat a diet that includes plenty of vegetables, fruits, low-fat dairy products, and lean protein. Do not eat a lot of foods that are high in solid fats, added sugars, or sodium. Maintain a healthy weight Body mass index (BMI) is a measurement that can be used to identify possible weight problems. It estimates body fat based on height and weight. Your health care provider can help determine your BMI and help you achieve or maintain a healthy weight. Get regular exercise Get regular exercise. This is one of the most important things you can do for your health. Most adults should: Exercise for at least 150 minutes each week. The exercise should increase your heart rate and make you sweat (moderate-intensity exercise). Do strengthening exercises at least twice a week. This is in addition to the moderate-intensity exercise. Spend less time sitting. Even light physical activity can be beneficial. Watch cholesterol and blood lipids Have your blood tested for lipids and cholesterol at 61 years of age, then have this test every 5 years. You may need to have your cholesterol levels checked more often if: Your lipid or cholesterol levels are high. You are older than 61 years of age. You are at high risk for heart disease. What should I know about cancer screening? Many types of cancers can be detected early and may often be prevented. Depending on your health history and family history, you may need to have cancer screening at various ages. This may include screening  for: Colorectal cancer. Prostate cancer. Skin cancer. Lung cancer. What should I know about heart disease, diabetes, and high blood pressure? Blood pressure and heart disease High blood pressure causes heart disease and increases the risk of stroke. This is more likely to develop in people who have high blood pressure readings or are overweight. Talk with your health care provider about your target blood pressure readings. Have your blood pressure checked: Every 3-5 years if you are 61-73 years of age. Every year if you are 58 years old or older. If you are between the ages of 72 and 61 and are a current or former smoker, ask your health care provider if you should have a one-time screening for abdominal aortic aneurysm (AAA). Diabetes Have regular diabetes screenings. This checks your fasting blood sugar level. Have the screening done: Once every three years after age 61 if you are at a normal weight and have a low risk for diabetes. More often and at a younger age if you are overweight or have a high risk for diabetes. What should I know about preventing infection? Hepatitis B If you have a higher risk for hepatitis B, you should be screened for this virus. Talk with your health care provider to find out if you are at risk for hepatitis B infection. Hepatitis C Blood testing is recommended for: Everyone born from [birth year] through 1963. Anyone with known risk factors for hepatitis C. Sexually transmitted infections (STIs) You should be screened each year for STIs, including gonorrhea and chlamydia, if: You are sexually active and are younger than 61 years  of age. You are older than 61 years of age and your health care provider tells you that you are at risk for this type of infection. Your sexual activity has changed since you were last screened, and you are at increased risk for chlamydia or gonorrhea. Ask your health care provider if you are at risk. Ask your health care provider about  whether you are at high risk for HIV. Your health care provider may recommend a prescription medicine to help prevent HIV infection. If you choose to take medicine to prevent HIV, you should first get tested for HIV. You should then be tested every 3 months for as long as you are taking the medicine. Follow these instructions at home: Alcohol use Do not drink alcohol if your health care provider tells you not to drink. If you drink alcohol: Limit how much you have to 0-2 drinks a day. Know how much alcohol is in your drink. In the [place] one drink equals one 12 oz bottle of beer (355 mL), one 5 oz glass of wine (148 mL), or one 1 oz glass of hard liquor (44 mL). Lifestyle Do not use any products that contain nicotine or tobacco. These products include cigarettes, chewing tobacco, and vaping devices, such as e-cigarettes. If you need help quitting, ask your health care provider. Do not use street drugs. Do not share needles. Ask your health care provider for help if you need support or information about quitting drugs. General instructions Schedule regular health, dental, and eye exams. Stay current with your vaccines. Tell your health care provider if: You often feel depressed. You have ever been abused or do not feel safe at home. Summary Adopting a healthy lifestyle and getting preventive care are important in promoting health and wellness. Follow your health care provider's instructions about healthy diet, exercising, and getting tested or screened for diseases. Follow your health care provider's instructions on monitoring your cholesterol and blood pressure. This information is not intended to replace advice given to you by your health care provider. Make sure you discuss any questions you have with your health care provider. Document Revised: 07/18/2020 Document Reviewed: 07/18/2020 Elsevier Patient Education  2024 [employer].

## 2023-05-07 DIAGNOSIS — L570 Actinic keratosis: Secondary | ICD-10-CM | POA: Diagnosis not present

## 2023-05-07 DIAGNOSIS — D225 Melanocytic nevi of trunk: Secondary | ICD-10-CM | POA: Diagnosis not present

## 2023-05-07 DIAGNOSIS — L719 Rosacea, unspecified: Secondary | ICD-10-CM | POA: Diagnosis not present

## 2023-05-07 DIAGNOSIS — L578 Other skin changes due to chronic exposure to nonionizing radiation: Secondary | ICD-10-CM | POA: Diagnosis not present

## 2023-05-07 DIAGNOSIS — L219 Seborrheic dermatitis, unspecified: Secondary | ICD-10-CM | POA: Diagnosis not present

## 2023-07-10 ENCOUNTER — Other Ambulatory Visit: Payer: Self-pay | Admitting: Family Medicine

## 2023-07-10 DIAGNOSIS — F329 Major depressive disorder, single episode, unspecified: Secondary | ICD-10-CM

## 2023-12-03 ENCOUNTER — Encounter: Payer: Self-pay | Admitting: Internal Medicine

## 2024-02-10 DIAGNOSIS — H02831 Dermatochalasis of right upper eyelid: Secondary | ICD-10-CM | POA: Diagnosis not present

## 2024-02-10 DIAGNOSIS — H524 Presbyopia: Secondary | ICD-10-CM | POA: Diagnosis not present

## 2024-02-10 DIAGNOSIS — H2513 Age-related nuclear cataract, bilateral: Secondary | ICD-10-CM | POA: Diagnosis not present

## 2024-02-10 DIAGNOSIS — H02834 Dermatochalasis of left upper eyelid: Secondary | ICD-10-CM | POA: Diagnosis not present
